# Patient Record
Sex: Male | Born: 1975 | Race: White | Hispanic: No | Marital: Married | Smoking: Current every day smoker
Health system: Southern US, Community
[De-identification: ages and names within clinical notes are randomized; demographics above are authoritative.]

## PROBLEM LIST (undated history)

## (undated) DIAGNOSIS — J45909 Unspecified asthma, uncomplicated: Secondary | ICD-10-CM

## (undated) DIAGNOSIS — G4730 Sleep apnea, unspecified: Secondary | ICD-10-CM

## (undated) DIAGNOSIS — J189 Pneumonia, unspecified organism: Secondary | ICD-10-CM

## (undated) HISTORY — DX: Unspecified asthma, uncomplicated: J45.909

---

## 2015-01-18 ENCOUNTER — Telehealth: Payer: Self-pay | Admitting: Family Medicine

## 2015-01-18 NOTE — Telephone Encounter (Signed)
Patient aware that our 1st new patient appointment will not be until the end of April and he is going to try and find somewhere else to be seen before then.

## 2016-02-19 ENCOUNTER — Emergency Department (HOSPITAL_COMMUNITY): Payer: Self-pay

## 2016-02-19 ENCOUNTER — Emergency Department (HOSPITAL_COMMUNITY)
Admission: EM | Admit: 2016-02-19 | Discharge: 2016-02-19 | Disposition: A | Discharge status: Home / Self Care | Payer: Self-pay | Attending: Emergency Medicine | Admitting: Emergency Medicine

## 2016-02-19 ENCOUNTER — Encounter (HOSPITAL_COMMUNITY): Payer: Self-pay | Admitting: *Deleted

## 2016-02-19 DIAGNOSIS — S43401A Unspecified sprain of right shoulder joint, initial encounter: Secondary | ICD-10-CM | POA: Insufficient documentation

## 2016-02-19 DIAGNOSIS — Y999 Unspecified external cause status: Secondary | ICD-10-CM | POA: Insufficient documentation

## 2016-02-19 DIAGNOSIS — Y9389 Activity, other specified: Secondary | ICD-10-CM | POA: Insufficient documentation

## 2016-02-19 DIAGNOSIS — F17210 Nicotine dependence, cigarettes, uncomplicated: Secondary | ICD-10-CM | POA: Insufficient documentation

## 2016-02-19 DIAGNOSIS — Y929 Unspecified place or not applicable: Secondary | ICD-10-CM | POA: Insufficient documentation

## 2016-02-19 DIAGNOSIS — X509XXA Other and unspecified overexertion or strenuous movements or postures, initial encounter: Secondary | ICD-10-CM | POA: Insufficient documentation

## 2016-02-19 MED ORDER — KETOROLAC TROMETHAMINE 60 MG/2ML IM SOLN
60.0000 mg | Freq: Once | INTRAMUSCULAR | Status: AC
  Administered 2016-02-19: 60 mg via INTRAMUSCULAR
  Filled 2016-02-19: qty 2

## 2016-02-19 MED ORDER — DIAZEPAM 5 MG PO TABS
5.0000 mg | ORAL_TABLET | Freq: Once | ORAL | Status: AC
  Administered 2016-02-19: 5 mg via ORAL
  Filled 2016-02-19: qty 1

## 2016-02-19 MED ORDER — CYCLOBENZAPRINE HCL 10 MG PO TABS: 10.0000 mg | ORAL_TABLET | Freq: Two times a day (BID) | ORAL | Status: DC | PRN

## 2016-02-19 MED ORDER — OXYCODONE-ACETAMINOPHEN 5-325 MG PO TABS
2.0000 | ORAL_TABLET | Freq: Once | ORAL | Status: AC
  Administered 2016-02-19: 2 via ORAL
  Filled 2016-02-19: qty 2

## 2016-02-19 MED ORDER — OXYCODONE-ACETAMINOPHEN 5-325 MG PO TABS: 2.0000 | ORAL_TABLET | ORAL | Status: DC | PRN

## 2016-02-19 MED ORDER — IBUPROFEN 800 MG PO TABS: 800.0000 mg | ORAL_TABLET | Freq: Three times a day (TID) | ORAL | Status: DC | PRN

## 2016-02-19 NOTE — ED Provider Notes (Signed)
CSN: [id number]     Arrival date & time 02/19/16  1957 History   First MD Initiated Contact with Patient 02/19/16 1959     Chief Complaint  Patient presents with  . Shoulder Pain     (Consider location/radiation/quality/duration/timing/severity/associated sxs/prior Treatment) Patient is a 40 y.o. male presenting with shoulder pain.  Shoulder Pain Location:  Shoulder Injury: no   Pain details:    Quality:  Aching and sharp   Radiates to:  Chest   Severity:  Mild   Onset quality:  Gradual   Duration:  3 days   Timing:  Constant   Progression:  Worsening Chronicity:  New Dislocation: no   Prior injury to area:  No Relieved by:  None tried Worsened by:  Nothing tried Ineffective treatments:  None tried Associated symptoms: no back pain, no fatigue and no fever     History reviewed. No pertinent past medical history. History reviewed. No pertinent past surgical history. No family history on file. Social History  Substance Use Topics  . Smoking status: Current Every Day Smoker    Types: Cigarettes  . Smokeless tobacco: None  . Alcohol Use: Yes     Comment: occ    Review of Systems  Constitutional: Negative for fever and fatigue.  Musculoskeletal: Negative for back pain.       Right shoulder pain  All other systems reviewed and are negative.     Allergies  Review of patient's allergies indicates no known allergies.  Home Medications   Prior to Admission medications   Medication Sig Start Date End Date Taking? Authorizing Provider  cyclobenzaprine (FLEXERIL) 10 MG tablet Take 1 tablet (10 mg total) by mouth 2 (two) times daily as needed for muscle spasms. 02/19/16   Marily MemosJason Rosevelt Luu, MD  ibuprofen (ADVIL,MOTRIN) 800 MG tablet Take 1 tablet (800 mg total) by mouth every 8 (eight) hours as needed for moderate pain. 02/19/16   Marily MemosJason Kyre Jeffries, MD  oxyCODONE-acetaminophen (PERCOCET/ROXICET) 5-325 MG tablet Take 2 tablets by mouth every 4 (four) hours as needed for severe pain.  02/19/16   Desi Rowe, MD   BP 130/81 mmHg  Pulse 105  Temp(Src) 98.7 F (37.1 C) (Oral)  Resp 22  Ht 6\' 1"  (1.854 m)  Wt 240 lb (108.863 kg)  BMI 31.67 kg/m2  SpO2 95% Physical Exam  Constitutional: He is oriented to person, place, and time. He appears well-developed and well-nourished.  HENT:  Head: Normocephalic and atraumatic.  Neck: Normal range of motion.  Cardiovascular: Normal rate.   Pulmonary/Chest: Effort normal. No respiratory distress.  Abdominal: Soft. He exhibits no distension. There is no tenderness.  Musculoskeletal: Normal range of motion. He exhibits tenderness (with ROM of right shoulder that radiates down through his latissimus on the same side with ttp in right sided latissimus dorsi).  Neurological: He is alert and oriented to person, place, and time. No cranial nerve deficit. Coordination normal.  Skin: Skin is warm and dry.  Nursing note and vitals reviewed.   ED Course  Procedures (including critical care time) Labs Review Labs Reviewed - No data to display  Imaging Review Dg Shoulder Right  02/19/2016  CLINICAL DATA:  Right shoulder pain since trying to pull a rope 3 weeks ago. Initial encounter. EXAM: RIGHT SHOULDER - 2+ VIEW COMPARISON:  None. FINDINGS: There is no evidence of fracture or dislocation. There is no evidence of arthropathy or other focal bone abnormality. Soft tissues are unremarkable. IMPRESSION: Negative. Electronically Signed   By: Marnee SpringJonathon  Watts  M.D.   On: 02/19/2016 20:39   I have personally reviewed and evaluated these images and lab results as part of my medical decision-making.   EKG Interpretation None      MDM   Final diagnoses:  Shoulder sprain, right, initial encounter   Suspect muscle spasm. Will tx w/ pain meds and muscle relaxer. Will xr to ensure no bone tumors or other abnormality.  XR negative. Symptoms improved significantly with medications. Still suspect likely muscular etiology. Will dc with short course  of pain meds, motrin and muscle relaxers.   New Prescriptions: Discharge Medication List as of 02/19/2016  8:43 PM    START taking these medications   Details  cyclobenzaprine (FLEXERIL) 10 MG tablet Take 1 tablet (10 mg total) by mouth 2 (two) times daily as needed for muscle spasms., Starting 02/19/2016, Until Discontinued, Print    ibuprofen (ADVIL,MOTRIN) 800 MG tablet Take 1 tablet (800 mg total) by mouth every 8 (eight) hours as needed for moderate pain., Starting 02/19/2016, Until Discontinued, Print    oxyCODONE-acetaminophen (PERCOCET/ROXICET) 5-325 MG tablet Take 2 tablets by mouth every 4 (four) hours as needed for severe pain., Starting 02/19/2016, Until Discontinued, Print         I have personally and contemperaneously reviewed labs and imaging and used in my decision making as above.   A medical screening exam was performed and I feel the patient has had an appropriate workup for their chief complaint at this time and likelihood of emergent condition existing is low. Their vital signs are stable. They have been counseled on decision, discharge, follow up and which symptoms necessitate immediate return to the emergency department.  They verbally stated understanding and agreement with plan and discharged in stable condition.      Marily Memos, MD 02/20/16 [phone]

## 2016-02-19 NOTE — ED Notes (Signed)
Pt reports pulling on a rope 3 days ago trying to start a piece of equipment. States increased pain to the right shoulder since. Denies any injury.

## 2016-02-19 NOTE — Discharge Instructions (Signed)
The sling is for comfort only. As much as you can, perform shoulder exercises as in the hand-out to avoid having a frozen shoulder. As soon as you can get back to moving it fully without pain, do not use the sling any longer.

## 2016-02-19 NOTE — ED Notes (Signed)
Pt alert & oriented x4, stable gait. Patient given discharge instructions, paperwork & prescription(s). Patient informed not to drive, operate any equipment & handel any important documents 4 hours after taking pain medication. Patient  instructed to stop at the registration desk to finish any additional paperwork. Patient  verbalized understanding. Pt left department w/ no further questions. 

## 2016-02-21 ENCOUNTER — Inpatient Hospital Stay (HOSPITAL_COMMUNITY)
Admission: EM | Admit: 2016-02-21 | Discharge: 2016-03-02 | DRG: 853 | Disposition: A | Discharge status: Home / Self Care | Payer: Self-pay | Attending: Internal Medicine | Admitting: Internal Medicine

## 2016-02-21 ENCOUNTER — Emergency Department (HOSPITAL_COMMUNITY): Payer: Self-pay

## 2016-02-21 ENCOUNTER — Encounter (HOSPITAL_COMMUNITY): Payer: Self-pay | Admitting: Emergency Medicine

## 2016-02-21 DIAGNOSIS — F17210 Nicotine dependence, cigarettes, uncomplicated: Secondary | ICD-10-CM | POA: Diagnosis present

## 2016-02-21 DIAGNOSIS — E876 Hypokalemia: Secondary | ICD-10-CM | POA: Diagnosis not present

## 2016-02-21 DIAGNOSIS — J80 Acute respiratory distress syndrome: Secondary | ICD-10-CM | POA: Diagnosis not present

## 2016-02-21 DIAGNOSIS — J9601 Acute respiratory failure with hypoxia: Secondary | ICD-10-CM | POA: Diagnosis present

## 2016-02-21 DIAGNOSIS — R079 Chest pain, unspecified: Secondary | ICD-10-CM | POA: Diagnosis present

## 2016-02-21 DIAGNOSIS — J189 Pneumonia, unspecified organism: Secondary | ICD-10-CM | POA: Diagnosis present

## 2016-02-21 DIAGNOSIS — Z978 Presence of other specified devices: Secondary | ICD-10-CM | POA: Diagnosis present

## 2016-02-21 DIAGNOSIS — R6520 Severe sepsis without septic shock: Secondary | ICD-10-CM | POA: Diagnosis present

## 2016-02-21 DIAGNOSIS — J869 Pyothorax without fistula: Secondary | ICD-10-CM | POA: Diagnosis present

## 2016-02-21 DIAGNOSIS — J9602 Acute respiratory failure with hypercapnia: Secondary | ICD-10-CM

## 2016-02-21 DIAGNOSIS — R0902 Hypoxemia: Secondary | ICD-10-CM

## 2016-02-21 DIAGNOSIS — Z4682 Encounter for fitting and adjustment of non-vascular catheter: Secondary | ICD-10-CM

## 2016-02-21 DIAGNOSIS — J9690 Respiratory failure, unspecified, unspecified whether with hypoxia or hypercapnia: Secondary | ICD-10-CM

## 2016-02-21 DIAGNOSIS — Z9889 Other specified postprocedural states: Secondary | ICD-10-CM

## 2016-02-21 DIAGNOSIS — A419 Sepsis, unspecified organism: Principal | ICD-10-CM | POA: Diagnosis present

## 2016-02-21 DIAGNOSIS — K9189 Other postprocedural complications and disorders of digestive system: Secondary | ICD-10-CM

## 2016-02-21 DIAGNOSIS — I10 Essential (primary) hypertension: Secondary | ICD-10-CM | POA: Diagnosis present

## 2016-02-21 DIAGNOSIS — R109 Unspecified abdominal pain: Secondary | ICD-10-CM

## 2016-02-21 DIAGNOSIS — J989 Respiratory disorder, unspecified: Secondary | ICD-10-CM

## 2016-02-21 DIAGNOSIS — M25511 Pain in right shoulder: Secondary | ICD-10-CM | POA: Diagnosis present

## 2016-02-21 DIAGNOSIS — K567 Ileus, unspecified: Secondary | ICD-10-CM | POA: Diagnosis not present

## 2016-02-21 DIAGNOSIS — J90 Pleural effusion, not elsewhere classified: Secondary | ICD-10-CM | POA: Diagnosis present

## 2016-02-21 DIAGNOSIS — J9600 Acute respiratory failure, unspecified whether with hypoxia or hypercapnia: Secondary | ICD-10-CM | POA: Diagnosis present

## 2016-02-21 DIAGNOSIS — G4733 Obstructive sleep apnea (adult) (pediatric): Secondary | ICD-10-CM | POA: Diagnosis present

## 2016-02-21 DIAGNOSIS — R0602 Shortness of breath: Secondary | ICD-10-CM | POA: Diagnosis present

## 2016-02-21 MED ORDER — SODIUM CHLORIDE 0.9 % IV BOLUS (SEPSIS)
1000.0000 mL | Freq: Once | INTRAVENOUS | Status: AC
  Administered 2016-02-22: 1000 mL via INTRAVENOUS

## 2016-02-21 MED ORDER — SODIUM CHLORIDE 0.9 % IV SOLN
INTRAVENOUS | Status: DC
  Administered 2016-02-22: 02:00:00 via INTRAVENOUS

## 2016-02-21 MED ORDER — FENTANYL CITRATE (PF) 100 MCG/2ML IJ SOLN
50.0000 ug | Freq: Once | INTRAMUSCULAR | Status: AC
  Administered 2016-02-22: 50 ug via INTRAVENOUS
  Filled 2016-02-21: qty 2

## 2016-02-21 NOTE — ED Notes (Signed)
Patient states he was treated here on April 15 for "a pulled muscle in my rotator cuff and it goes down into my right side." States he was given flexeril and ibuprofen and the medication is not working. States he cannot get a deep breath due to the pain.

## 2016-02-21 NOTE — ED Notes (Signed)
Pt states he took his flexeril and ibuprofen while in the waiting room to see if it would relieve his pain some

## 2016-02-21 NOTE — ED Provider Notes (Signed)
CSN: [id number]     Arrival date & time 02/21/16  2040 History  By signing my name below, I, [name] Snow, attest that this documentation has been prepared under the direction and in the presence of Devoria Albe, MD at 2331 PM. Electronically Signed: Phillis Snow, ED Scribe. 02/21/2016. 11:45 PM.  Chief Complaint  Patient presents with  . Muscle Pain   The history is provided by the patient. No language interpreter was used.  HPI Comments: [name] Snow is a 40 y.o. male who presents to the Emergency Department complaining of constant, gradually worsening right shoulder pain that radiates to the right upper back onset one week ago. Pt was pulling the starter rope on a tiller and thinks he injured his right shoulder. He states that the pain was not there initially, but started a day later and gradually worsened. He states that coughing has worsened the pain and that he has been coughing more today. He also reports that any sort of movement worsens the pain. He reports associated SOB and spasm-like pain in his right axilla that began when he hurt his shoulder. Pt was seen and evaluated in the ED on 02/19/16. Pt was discharged with a negative x-ray, diagnosis of muscle spasm, and prescribed Flexeril, Motrin and percocet. He denies alleviating factors. He denies fever, chills, or hemoptysis. Pt is a smoker, 1 ppd.   PCP none  History reviewed. No pertinent past medical history. History reviewed. No pertinent past surgical history. History reviewed. No pertinent family history. Social History  Substance Use Topics  . Smoking status: Current Every Day Smoker    Types: Cigarettes  . Smokeless tobacco: None  . Alcohol Use: Yes     Comment: occ  smokes 1 ppd unemployed  Review of Systems  Constitutional: Negative for fever and chills.  Respiratory: Positive for cough and shortness of breath.   Gastrointestinal: Positive for abdominal pain.  Musculoskeletal: Positive for arthralgias.  All  other systems reviewed and are negative.  Allergies  Review of patient's allergies indicates no known allergies.  Home Medications   Prior to Admission medications   Medication Sig Start Date End Date Taking? Authorizing Provider  cyclobenzaprine (FLEXERIL) 10 MG tablet Take 1 tablet (10 mg total) by mouth 2 (two) times daily as needed for muscle spasms. 02/19/16  Yes Marily Memos, MD  ibuprofen (ADVIL,MOTRIN) 800 MG tablet Take 1 tablet (800 mg total) by mouth every 8 (eight) hours as needed for moderate pain. 02/19/16  Yes Marily Memos, MD  oxyCODONE-acetaminophen (PERCOCET/ROXICET) 5-325 MG tablet Take 2 tablets by mouth every 4 (four) hours as needed for severe pain. Patient not taking: Reported on 02/21/2016 02/19/16   Marily Memos, MD   BP 122/84 mmHg  Pulse 116  Temp(Src) 97.4 F (36.3 C) (Oral)  Resp 22  Ht  (1.854 m)  Wt 237 lb 3.4 oz (107.6 kg)  BMI 31.30 kg/m2  SpO2 93% Physical Exam  Constitutional: He is oriented to person, place, and time. He appears well-developed and well-nourished.  Non-toxic appearance. He does not appear ill. No distress.  HENT:  Head: Normocephalic and atraumatic.  Right Ear: External ear normal.  Left Ear: External ear normal.  Nose: Nose normal. No mucosal edema or rhinorrhea.  Mouth/Throat: Oropharynx is clear and moist and mucous membranes are normal. No dental abscesses or uvula swelling.  Eyes: Conjunctivae and EOM are normal. Pupils are equal, round, and reactive to light.  Neck: Normal range of motion and full passive range of  motion without pain. Neck supple.  Cardiovascular: Normal rate, regular rhythm and normal heart sounds.  Exam reveals no gallop and no friction rub.   No murmur heard. Pulmonary/Chest: Accessory muscle usage present. Tachypnea noted. He is in respiratory distress. He has decreased breath sounds in the right upper field, the right middle field and the right lower field. He has no wheezes. He has no rhonchi. He has  no rales. He exhibits no tenderness and no crepitus.  Patient has decreased tactile fremitus on the right. He has egophony on the right. He has very diminished breath sounds especially at the right base. There are still some breath sounds in the apices  Abdominal: Soft. Normal appearance and bowel sounds are normal. He exhibits no distension. There is no tenderness. There is no rebound and no guarding.  Musculoskeletal: Normal range of motion. He exhibits no edema or tenderness.  Moves all extremities well.   Neurological: He is alert and oriented to person, place, and time. He has normal strength. No cranial nerve deficit.  Skin: Skin is warm, dry and intact. No rash noted. No erythema. No pallor.  Psychiatric: He has a normal mood and affect. His speech is normal and behavior is normal. His mood appears not anxious.  Nursing note and vitals reviewed.   ED Course  Procedures (including critical care time)  Medications  0.9 %  sodium chloride infusion ( Intravenous New Bag/Given 02/22/16 0147)  azithromycin (ZITHROMAX) 500 mg in dextrose 5 % 250 mL IVPB (500 mg Intravenous New Bag/Given 02/22/16 0144)  fentaNYL (SUBLIMAZE) injection 50 mcg (50 mcg Intravenous Given 02/22/16 0027)  sodium chloride 0.9 % bolus 1,000 mL (0 mLs Intravenous Stopped 02/22/16 0127)  cefTRIAXone (ROCEPHIN) 1 g in dextrose 5 % 50 mL IVPB (0 g Intravenous Stopped 02/22/16 0143)  fentaNYL (SUBLIMAZE) injection 100 mcg (100 mcg Intravenous Given 02/22/16 0142)  ketorolac (TORADOL) 30 MG/ML injection 30 mg (30 mg Intravenous Given 02/22/16 0141)   DIAGNOSTIC STUDIES: Oxygen Saturation is 96% on RA, normal by my interpretation.    COORDINATION OF CARE: 11:42 PM-Discussed treatment plan which includes chest x-ray to include right ribs, IV and pain medication with pt at bedside and pt agreed to plan.   12:30 AM After reviewing patient's chest x-ray he was started on community acquired pneumonia antibiotics, Rocephin and  Zithromax. I talked to the patient about his test results. We discussed getting a CT of his chest to get a clearer idea of what was going on.   Patient had difficulty and was unable to do the CT scan until he received more pain medication. He states it was too painful to lay down.  Patient CT scan shows probable pneumonia and a large pleural effusion. I have talked to patient that he will need admission.  Patient's pulse ox remained in the mid 90s without oxygen. After his blood gas was obtained he was placed on nasal cannula 2 L/m nasal cannula.  02:25 Dr Onalee Hua, admit to ICU, requests ABG, lactic acid, HIV    Labs Review Results for orders placed or performed during the hospital encounter of 02/21/16  Comprehensive metabolic panel  Result Value Ref Range   Sodium 135 135 - 145 mmol/L   Potassium 3.9 3.5 - 5.1 mmol/L   Chloride 104 101 - 111 mmol/L   CO2 22 22 - 32 mmol/L   Glucose, Bld 113 (H) 65 - 99 mg/dL   BUN 17 6 - 20 mg/dL   Creatinine, Ser 1.61 0.61 -  1.24 mg/dL   Calcium 8.0 (L) 8.9 - 10.3 mg/dL   Total Protein 6.8 6.5 - 8.1 g/dL   Albumin 3.5 3.5 - 5.0 g/dL   AST 19 15 - 41 U/L   ALT 41 17 - 63 U/L   Alkaline Phosphatase 57 38 - 126 U/L   Total Bilirubin 2.2 (H) 0.3 - 1.2 mg/dL   GFR calc non Af Amer >60 >60 mL/min   GFR calc Af Amer >60 >60 mL/min   Anion gap 9 5 - 15  CBC with Differential  Result Value Ref Range   WBC 11.2 (H) 4.0 - 10.5 K/uL   RBC 4.29 4.22 - 5.81 MIL/uL   Hemoglobin 13.2 13.0 - 17.0 g/dL   HCT 16.1 (L) 09.6 - 04.5 %   MCV 87.9 78.0 - 100.0 fL   MCH 30.8 26.0 - 34.0 pg   MCHC 35.0 30.0 - 36.0 g/dL   RDW 40.9 81.1 - 91.4 %   Platelets 140 (L) 150 - 400 K/uL   Neutrophils Relative % 83 %   Neutro Abs 9.2 (H) 1.7 - 7.7 K/uL   Lymphocytes Relative 9 %   Lymphs Abs 1.1 0.7 - 4.0 K/uL   Monocytes Relative 8 %   Monocytes Absolute 0.9 0.1 - 1.0 K/uL   Eosinophils Relative 0 %   Eosinophils Absolute 0.1 0.0 - 0.7 K/uL   Basophils Relative 0 %    Basophils Absolute 0.0 0.0 - 0.1 K/uL  Lactic acid, plasma  Result Value Ref Range   Lactic Acid, Venous 0.8 0.5 - 2.0 mmol/L  Blood gas, arterial  Result Value Ref Range   FIO2 0.21    pH, Arterial 7.419 7.350 - 7.450   pCO2 arterial 34.3 (L) 35.0 - 45.0 mmHg   pO2, Arterial 59.8 (L) 80.0 - 100.0 mmHg   Bicarbonate 22.9 20.0 - 24.0 mEq/L   Acid-base deficit 2.0 0.0 - 2.0 mmol/L   O2 Saturation 90.3 %   Patient temperature 37.0    Allens test (pass/fail) POSITIVE (A) PASS  Procalcitonin - Baseline  Result Value Ref Range   Procalcitonin 0.53 ng/mL  Troponin I  Result Value Ref Range   Troponin I <0.03 <0.031 ng/mL   Laboratory interpretation all normal except leukocytosis, ABG shows hypoxia on room air     Imaging Review Dg Ribs Unilateral W/chest Right  02/22/2016  CLINICAL DATA:  Shortness of breath. Increasing right chest and rib pain. Diminished breath sounds on the right. EXAM: RIGHT RIBS AND CHEST - 3+ VIEW COMPARISON:  None. FINDINGS: Complete opacification of the lower 1/2-2/3 of right hemithorax. This likely represents a combination of pleural fluid atelectasis or consolidation. Questionable trace left pleural effusion. Heart is normal in size, partially obscured by right hemithorax opacity. No pulmonary edema. No pneumothorax. No fracture or acute bony abnormality of the right ribs. IMPRESSION: 1. Opacification of the lower 1/2 of right hemithorax, likely combination of pleural fluid and atelectasis or consolidation. No pneumothorax or acute rib fracture is seen. 2. Question small left pleural effusion. Electronically Signed   By: Rubye Oaks M.D.   On: 02/22/2016 00:33   Ct Chest Wo Contrast  02/22/2016  CLINICAL DATA:  Right shoulder pain radiating to the right upper back for 1 week. Patient injured the right shoulder while pulling on patellar. Cough today. Shortness of breath. Follow-up of abnormal chest x-ray. EXAM: CT CHEST WITHOUT CONTRAST TECHNIQUE:  Multidetector CT imaging of the chest was performed following the standard protocol without IV contrast.  COMPARISON:  Chest radiograph 02/22/2016 FINDINGS: Normal heart size. Mild pericardial thickening. Normal caliber thoracic aorta. No significant lymphadenopathy in the chest. Esophagus is mostly decompressed. Evaluation of lungs is limited due to respiratory motion artifact. There is a moderate-sized right pleural effusion with consolidation in the right lung base. Fluid in the fissures. Changes are nonspecific but could indicate pneumonia. Emphysematous changes in the lungs. Airways appear patent. Included portions of the upper abdominal organs demonstrate no gross abnormality in the unenhanced imaging. Degenerative changes in the spine. No destructive bone lesions. Ribs appear intact. IMPRESSION: Moderate size right pleural effusion with basilar consolidation. Appearance is nonspecific but could indicate pneumonia. Followup PA and lateral chest X-ray is recommended in 3-4 weeks following trial of antibiotic therapy to ensure resolution and exclude underlying malignancy. Electronically Signed   By: Burman NievesWilliam  Stevens M.D.   On: 02/22/2016 02:03   I have personally reviewed and evaluated these images and lab results as part of my medical decision-making.   EKG Interpretation   Date/Time:  Tuesday February 22 2016 02:48:43 EDT Ventricular Rate:  113 PR Interval:  151 QRS Duration: 97 QT Interval:  323 QTC Calculation: 443 R Axis:   115 Text Interpretation:  Sinus tachycardia Right axis deviation No old  tracing to compare Confirmed by Javani Spratt  MD-I, Aneya Daddona ([id number]) on 02/22/2016  3:03:45 AM      MDM   Final diagnoses:  Pleural effusion, right  CAP (community acquired pneumonia)  Respiratory disease   Plan admission   Devoria AlbeIva Zanobia Griebel, MD, FACEP    I personally performed the services described in this documentation, which was scribed in my presence. The recorded information has been reviewed and  considered.  Devoria AlbeIva Chaunice Obie, MD, Concha PyoFACEP   Carzell Saldivar, MD 02/22/16 [phone]

## 2016-02-21 NOTE — ED Notes (Signed)
Called from registration with patient complaining of feeling like he was going to pass out. Brought patient to triage and obtained new set of vital signs. Patient's respirations 30 bpm. Patient with shallow respirations, states "I can't slow down my breathing or get a deep breath because it hurts."

## 2016-02-22 ENCOUNTER — Inpatient Hospital Stay (HOSPITAL_COMMUNITY): Payer: Self-pay

## 2016-02-22 ENCOUNTER — Emergency Department (HOSPITAL_COMMUNITY): Payer: Self-pay

## 2016-02-22 DIAGNOSIS — R0600 Dyspnea, unspecified: Secondary | ICD-10-CM

## 2016-02-22 DIAGNOSIS — J90 Pleural effusion, not elsewhere classified: Secondary | ICD-10-CM | POA: Diagnosis present

## 2016-02-22 DIAGNOSIS — J9600 Acute respiratory failure, unspecified whether with hypoxia or hypercapnia: Secondary | ICD-10-CM | POA: Diagnosis present

## 2016-02-22 DIAGNOSIS — R079 Chest pain, unspecified: Secondary | ICD-10-CM | POA: Diagnosis present

## 2016-02-22 DIAGNOSIS — J189 Pneumonia, unspecified organism: Secondary | ICD-10-CM | POA: Diagnosis present

## 2016-02-22 DIAGNOSIS — R0602 Shortness of breath: Secondary | ICD-10-CM | POA: Diagnosis present

## 2016-02-22 DIAGNOSIS — J989 Respiratory disorder, unspecified: Secondary | ICD-10-CM

## 2016-02-22 LAB — COMPREHENSIVE METABOLIC PANEL
ALBUMIN: 3.4 g/dL — AB (ref 3.5–5.0)
ALK PHOS: 56 U/L (ref 38–126)
ALT: 41 U/L (ref 17–63)
ALT: 38 U/L (ref 17–63)
ANION GAP: 9 (ref 5–15)
AST: 19 U/L (ref 15–41)
AST: 17 U/L (ref 15–41)
Albumin: 3.5 g/dL (ref 3.5–5.0)
Alkaline Phosphatase: 57 U/L (ref 38–126)
Anion gap: 9 (ref 5–15)
BILIRUBIN TOTAL: 1.6 mg/dL — AB (ref 0.3–1.2)
BUN: 17 mg/dL (ref 6–20)
BUN: 17 mg/dL (ref 6–20)
CALCIUM: 8.2 mg/dL — AB (ref 8.9–10.3)
CHLORIDE: 104 mmol/L (ref 101–111)
CO2: 22 mmol/L (ref 22–32)
CO2: 21 mmol/L — AB (ref 22–32)
CREATININE: 1.05 mg/dL (ref 0.61–1.24)
Calcium: 8 mg/dL — ABNORMAL LOW (ref 8.9–10.3)
Chloride: 106 mmol/L (ref 101–111)
Creatinine, Ser: 1.01 mg/dL (ref 0.61–1.24)
GFR calc Af Amer: >60 mL/min (ref >60)
GFR calc non Af Amer: >60 mL/min (ref >60)
GLUCOSE: 123 mg/dL — AB (ref 65–99)
Glucose, Bld: 113 mg/dL — ABNORMAL HIGH (ref 65–99)
POTASSIUM: 3.7 mmol/L (ref 3.5–5.1)
POTASSIUM: 3.9 mmol/L (ref 3.5–5.1)
SODIUM: 136 mmol/L (ref 135–145)
Sodium: 135 mmol/L (ref 135–145)
TOTAL PROTEIN: 6.4 g/dL — AB (ref 6.5–8.1)
Total Bilirubin: 2.2 mg/dL — ABNORMAL HIGH (ref 0.3–1.2)
Total Protein: 6.8 g/dL (ref 6.5–8.1)

## 2016-02-22 LAB — BODY FLUID CELL COUNT WITH DIFFERENTIAL
Eos, Fluid: 0 %
LYMPHS FL: 3 %
Monocyte-Macrophage-Serous Fluid: 3 % — ABNORMAL LOW (ref 50–90)
NEUTROPHIL FLUID: 94 % — AB (ref 0–25)
Other Cells, Fluid: 0 %
Total Nucleated Cell Count, Fluid: 3867 cu mm — ABNORMAL HIGH (ref 0–1000)

## 2016-02-22 LAB — INFLUENZA PANEL BY PCR (TYPE A & B)
H1N1FLUPCR: NOT DETECTED
INFLAPCR: NEGATIVE
INFLBPCR: NEGATIVE

## 2016-02-22 LAB — LACTATE DEHYDROGENASE, PLEURAL OR PERITONEAL FLUID: LD, Fluid: 686 U/L — ABNORMAL HIGH (ref 3–23)

## 2016-02-22 LAB — CBC WITH DIFFERENTIAL/PLATELET
BASOS ABS: 0 10*3/uL (ref 0.0–0.1)
BASOS PCT: 0 %
Basophils Absolute: 0 10*3/uL (ref 0.0–0.1)
Basophils Relative: 0 %
EOS ABS: 0.1 10*3/uL (ref 0.0–0.7)
Eosinophils Absolute: 0.1 10*3/uL (ref 0.0–0.7)
Eosinophils Relative: 0 %
Eosinophils Relative: 1 %
HEMATOCRIT: 36.3 % — AB (ref 39.0–52.0)
HEMATOCRIT: 37.7 % — AB (ref 39.0–52.0)
HEMOGLOBIN: 12.6 g/dL — AB (ref 13.0–17.0)
HEMOGLOBIN: 13.2 g/dL (ref 13.0–17.0)
LYMPHS ABS: 1.1 10*3/uL (ref 0.7–4.0)
LYMPHS PCT: 9 %
Lymphocytes Relative: 9 %
Lymphs Abs: 1 10*3/uL (ref 0.7–4.0)
MCH: 30.8 pg (ref 26.0–34.0)
MCH: 30.4 pg (ref 26.0–34.0)
MCHC: 34.7 g/dL (ref 30.0–36.0)
MCHC: 35 g/dL (ref 30.0–36.0)
MCV: 87.7 fL (ref 78.0–100.0)
MCV: 87.9 fL (ref 78.0–100.0)
Monocytes Absolute: 0.9 10*3/uL (ref 0.1–1.0)
Monocytes Absolute: 0.8 10*3/uL (ref 0.1–1.0)
Monocytes Relative: 8 %
Monocytes Relative: 7 %
NEUTROS ABS: 9.3 10*3/uL — AB (ref 1.7–7.7)
NEUTROS PCT: 83 %
NEUTROS PCT: 83 %
Neutro Abs: 9.2 10*3/uL — ABNORMAL HIGH (ref 1.7–7.7)
Platelets: 140 10*3/uL — ABNORMAL LOW (ref 150–400)
Platelets: 143 10*3/uL — ABNORMAL LOW (ref 150–400)
RBC: 4.14 MIL/uL — ABNORMAL LOW (ref 4.22–5.81)
RBC: 4.29 MIL/uL (ref 4.22–5.81)
RDW: 14.4 % (ref 11.5–15.5)
RDW: 14.4 % (ref 11.5–15.5)
WBC: 11.1 10*3/uL — AB (ref 4.0–10.5)
WBC: 11.2 10*3/uL — AB (ref 4.0–10.5)

## 2016-02-22 LAB — LACTIC ACID, PLASMA
LACTIC ACID, VENOUS: 0.8 mmol/L (ref 0.5–2.0)
Lactic Acid, Venous: 0.8 mmol/L (ref 0.5–2.0)

## 2016-02-22 LAB — ECHOCARDIOGRAM COMPLETE
HEIGHTINCHES: 73 in
Weight: 3795.44 oz

## 2016-02-22 LAB — BLOOD GAS, ARTERIAL
ACID-BASE DEFICIT: 2 mmol/L (ref 0.0–2.0)
Allens test (pass/fail): POSITIVE — AB
BICARBONATE: 22.9 meq/L (ref 20.0–24.0)
FIO2: 0.21
O2 Saturation: 90.3 %
PCO2 ART: 34.3 mmHg — AB (ref 35.0–45.0)
PH ART: 7.419 (ref 7.350–7.450)
PO2 ART: 59.8 mmHg — AB (ref 80.0–100.0)
Patient temperature: 37

## 2016-02-22 LAB — BRAIN NATRIURETIC PEPTIDE: B Natriuretic Peptide: 30 pg/mL (ref 0.0–100.0)

## 2016-02-22 LAB — GLUCOSE, SEROUS FLUID: Glucose, Fluid: 67 mg/dL

## 2016-02-22 LAB — URINALYSIS, ROUTINE W REFLEX MICROSCOPIC
BILIRUBIN URINE: NEGATIVE
Glucose, UA: NEGATIVE mg/dL
Hgb urine dipstick: NEGATIVE
Ketones, ur: NEGATIVE mg/dL
Leukocytes, UA: NEGATIVE
NITRITE: NEGATIVE
PH: 5.5 (ref 5.0–8.0)
Protein, ur: NEGATIVE mg/dL
SPECIFIC GRAVITY, URINE: 1.01 (ref 1.005–1.030)

## 2016-02-22 LAB — MRSA PCR SCREENING: MRSA BY PCR: NEGATIVE

## 2016-02-22 LAB — TSH: TSH: 1.875 u[IU]/mL (ref 0.350–4.500)

## 2016-02-22 LAB — TROPONIN I: Troponin I: <0.03 ng/mL (ref <0.031)

## 2016-02-22 LAB — PROTEIN, BODY FLUID: Total protein, fluid: 5 g/dL

## 2016-02-22 LAB — PROCALCITONIN: PROCALCITONIN: 0.53 ng/mL

## 2016-02-22 LAB — STREP PNEUMONIAE URINARY ANTIGEN: Strep Pneumo Urinary Antigen: NEGATIVE

## 2016-02-22 MED ORDER — DEXTROSE 5 % IV SOLN
1.0000 g | INTRAVENOUS | Status: DC
  Administered 2016-02-22: 1 g via INTRAVENOUS
  Filled 2016-02-22 (×2): qty 10

## 2016-02-22 MED ORDER — SODIUM CHLORIDE 0.9 % IV SOLN
INTRAVENOUS | Status: AC
  Administered 2016-02-22: 04:00:00 via INTRAVENOUS

## 2016-02-22 MED ORDER — DEXTROSE 5 % IV SOLN
1.0000 g | Freq: Once | INTRAVENOUS | Status: AC
  Administered 2016-02-22: 1 g via INTRAVENOUS
  Filled 2016-02-22: qty 10

## 2016-02-22 MED ORDER — HYDROMORPHONE HCL 1 MG/ML IJ SOLN
1.0000 mg | INTRAMUSCULAR | Status: DC | PRN
  Administered 2016-02-22 – 2016-02-26 (×9): 1 mg via INTRAVENOUS
  Filled 2016-02-22 (×9): qty 1

## 2016-02-22 MED ORDER — VANCOMYCIN HCL 10 G IV SOLR
2000.0000 mg | Freq: Once | INTRAVENOUS | Status: AC
  Administered 2016-02-22: 2000 mg via INTRAVENOUS
  Filled 2016-02-22: qty 2000

## 2016-02-22 MED ORDER — KETOROLAC TROMETHAMINE 30 MG/ML IJ SOLN
30.0000 mg | Freq: Once | INTRAMUSCULAR | Status: AC
  Administered 2016-02-22: 30 mg via INTRAVENOUS
  Filled 2016-02-22: qty 1

## 2016-02-22 MED ORDER — IPRATROPIUM-ALBUTEROL 0.5-2.5 (3) MG/3ML IN SOLN
3.0000 mL | RESPIRATORY_TRACT | Status: DC | PRN
  Administered 2016-02-22: 3 mL via RESPIRATORY_TRACT
  Filled 2016-02-22: qty 3

## 2016-02-22 MED ORDER — DEXTROSE 5 % IV SOLN
500.0000 mg | INTRAVENOUS | Status: DC
  Administered 2016-02-22 – 2016-02-24 (×2): 500 mg via INTRAVENOUS
  Filled 2016-02-22 (×3): qty 500

## 2016-02-22 MED ORDER — DEXTROSE 5 % IV SOLN
500.0000 mg | INTRAVENOUS | Status: DC
  Administered 2016-02-22: 500 mg via INTRAVENOUS
  Filled 2016-02-22: qty 500

## 2016-02-22 MED ORDER — IPRATROPIUM-ALBUTEROL 0.5-2.5 (3) MG/3ML IN SOLN
3.0000 mL | Freq: Four times a day (QID) | RESPIRATORY_TRACT | Status: DC
  Administered 2016-02-23 – 2016-02-27 (×17): 3 mL via RESPIRATORY_TRACT
  Filled 2016-02-22 (×15): qty 3

## 2016-02-22 MED ORDER — FENTANYL CITRATE (PF) 100 MCG/2ML IJ SOLN
100.0000 ug | Freq: Once | INTRAMUSCULAR | Status: AC
  Administered 2016-02-22: 100 ug via INTRAVENOUS
  Filled 2016-02-22: qty 2

## 2016-02-22 MED ORDER — ACETAMINOPHEN 325 MG PO TABS
650.0000 mg | ORAL_TABLET | Freq: Four times a day (QID) | ORAL | Status: DC | PRN
  Administered 2016-02-22: 650 mg via ORAL
  Filled 2016-02-22: qty 2

## 2016-02-22 MED FILL — Oxycodone w/ Acetaminophen Tab 5-325 MG: ORAL | Qty: 6 | Status: AC

## 2016-02-22 NOTE — H&P (Signed)
PCP:   No primary care provider on file.   Chief Complaint:  Right shoulder pain  HPI: 40 yo male healthy comes in to ED actually for right shoulder pain that goes down into his right chest.  Pt reports he was doing some yard work and was pulling on something and injured his right shoulder.  He did not come in for sob.  Although he was very tachypneic on arrival to the ED.  Pt says he is always sob due to his sleep apnea that he has not been formally diagnosed with.  Pt denies any fevers.  Has been coughing and his breathing is worse when lying flat.  Pt also has been having some swelling in his legs for over 2 days.  Has pain to right chest worse with  Inspiration.  Pt did not think he was sick at old just injured his right shoulder.  Pt found to have right sided effusion/consolidation half of the lung with no PNX.  Pt referred for admission for pna.  Review of Systems:  Positive and negative as per HPI otherwise all other systems are negative  Past Medical History: History reviewed. No pertinent past medical history. History reviewed. No pertinent past surgical history. None  Medications: Prior to Admission medications   Medication Sig Start Date End Date Taking? Authorizing Provider  cyclobenzaprine (FLEXERIL) 10 MG tablet Take 1 tablet (10 mg total) by mouth 2 (two) times daily as needed for muscle spasms. 02/19/16  Yes Marily Memos, MD  ibuprofen (ADVIL,MOTRIN) 800 MG tablet Take 1 tablet (800 mg total) by mouth every 8 (eight) hours as needed for moderate pain. 02/19/16  Yes Marily Memos, MD  oxyCODONE-acetaminophen (PERCOCET/ROXICET) 5-325 MG tablet Take 2 tablets by mouth every 4 (four) hours as needed for severe pain. Patient not taking: Reported on 02/21/2016 02/19/16   Marily Memos, MD    Allergies:  No Known Allergies  Social History:  reports that he has been smoking Cigarettes.  He does not have any smokeless tobacco history on file. He reports that he drinks alcohol. He  reports that he does not use illicit drugs.  Family History: No premature CAD  Physical Exam: Filed Vitals:   02/21/16 2103 02/21/16 2149 02/21/16 2151 02/22/16 0234  BP: 122/92  143/86 122/84  Pulse: 125  119 116  Temp: 97.5 F (36.4 C)     TempSrc: Oral     Resp: Height:  (1.854 m)     Weight: 109.77 kg (242 lb)     SpO2: 95%  96% 93%   General appearance: alert, cooperative and mild distress tachynpeic able to speak in full sentences.  mentating normally, color good. Head: Normocephalic, without obvious abnormality, atraumatic Eyes: negative Nose: Nares normal. Septum midline. Mucosa normal. No drainage or sinus tenderness. Neck: no JVD and supple, symmetrical, trachea midline Lungs: diminished breath sounds RLL and RML Heart: regular rate and rhythm, S1, S2 normal, no murmur, click, rub or gallop Abdomen: soft, non-tender; bowel sounds normal; no masses,  no organomegaly Extremities: edema 1+ ble pitting edema Pulses: 2+ and symmetric Skin: Skin color, texture, turgor normal. No rashes or lesions Neurologic: Grossly normal   Labs on Admission:   Recent Labs  02/22/16 0046  NA 135  K 3.9  CL 104  CO2 22  GLUCOSE 113*  BUN 17  CREATININE 1.05  CALCIUM 8.0*    Recent Labs  02/22/16 0046  AST 19  ALT 41  ALKPHOS 57  BILITOT 2.2*  PROT 6.8  ALBUMIN 3.5    Recent Labs  02/22/16 0046  WBC 11.2*  NEUTROABS 9.2*  HGB 13.2  HCT 37.7*  MCV 87.9  PLT 140*   Radiological Exams on Admission: Dg Ribs Unilateral W/chest Right  02/22/2016  CLINICAL DATA:  Shortness of breath. Increasing right chest and rib pain. Diminished breath sounds on the right. EXAM: RIGHT RIBS AND CHEST - 3+ VIEW COMPARISON:  None. FINDINGS: Complete opacification of the lower 1/2-2/3 of right hemithorax. This likely represents a combination of pleural fluid atelectasis or consolidation. Questionable trace left pleural effusion. Heart is normal in size, partially  obscured by right hemithorax opacity. No pulmonary edema. No pneumothorax. No fracture or acute bony abnormality of the right ribs. IMPRESSION: 1. Opacification of the lower 1/2 of right hemithorax, likely combination of pleural fluid and atelectasis or consolidation. No pneumothorax or acute rib fracture is seen. 2. Question small left pleural effusion. Electronically Signed   By: Rubye OaksMelanie  Ehinger M.D.   On: 02/22/2016 00:33   Dg Shoulder Right  02/19/2016  CLINICAL DATA:  Right shoulder pain since trying to pull a rope 3 weeks ago. Initial encounter. EXAM: RIGHT SHOULDER - 2+ VIEW COMPARISON:  None. FINDINGS: There is no evidence of fracture or dislocation. There is no evidence of arthropathy or other focal bone abnormality. Soft tissues are unremarkable. IMPRESSION: Negative. Electronically Signed   By: Marnee SpringJonathon  Watts M.D.   On: 02/19/2016 20:39   Ct Chest Wo Contrast  02/22/2016  CLINICAL DATA:  Right shoulder pain radiating to the right upper back for 1 week. Patient injured the right shoulder while pulling on patellar. Cough today. Shortness of breath. Follow-up of abnormal chest x-ray. EXAM: CT CHEST WITHOUT CONTRAST TECHNIQUE: Multidetector CT imaging of the chest was performed following the standard protocol without IV contrast. COMPARISON:  Chest radiograph 02/22/2016 FINDINGS: Normal heart size. Mild pericardial thickening. Normal caliber thoracic aorta. No significant lymphadenopathy in the chest. Esophagus is mostly decompressed. Evaluation of lungs is limited due to respiratory motion artifact. There is a moderate-sized right pleural effusion with consolidation in the right lung base. Fluid in the fissures. Changes are nonspecific but could indicate pneumonia. Emphysematous changes in the lungs. Airways appear patent. Included portions of the upper abdominal organs demonstrate no gross abnormality in the unenhanced imaging. Degenerative changes in the spine. No destructive bone lesions. Ribs  appear intact. IMPRESSION: Moderate size right pleural effusion with basilar consolidation. Appearance is nonspecific but could indicate pneumonia. Followup PA and lateral chest X-ray is recommended in 3-4 weeks following trial of antibiotic therapy to ensure resolution and exclude underlying malignancy. Electronically Signed   By: Burman NievesWilliam  Stevens M.D.   On: 02/22/2016 02:03    cxr xray reviewed with right consolidation half way with effusion Case discussed with dr i knapp in ed Records reviewed ekg reviewed sinus tachycardia rate 113 no acute issues  Assessment/Plan  [age] yo male with acute respiratory distress from right pleural effusion with consolidation likely from CAP  Principal Problem:   CAP (community acquired pneumonia)- presumptive.  Place on pna pathway, add iv vancomycin to rocephin/azithro given in ED due to severity of illness.  im concerned this may be cardiac in etiology, please see discussion below.  Obtain blood and sputum cultures.  Monitor closely in ICU for further respiratory compromise requiring intubation in the next day or so.  Will also order IR to do thoracentesis in am with appropriate studies to evaluate effusion, will hold anticoagulants for  this reason.  Check HIV and obtain pulmonary consult with dr Juanetta Gosling in am.   Active Problems:   ARF (acute respiratory failure) (HCC)- in setting of nml wbc, nml lactic , swelling in his legs will also do cardiac evaluation.  Check trop, bnp and cardiac echo in am.   SOB (shortness of breath)- noted   Chest pain- noted   Pleural effusion on right- order thoracentesis in am  Admit to icu.  Full code.  Caralee Morea A 02/22/2016, 2:38 AM

## 2016-02-22 NOTE — Progress Notes (Signed)
eLink Physician-Brief Progress Note Patient Name: [name] SplinterJames [name] DOB: 1976-06-20 MRN: [id number]   Date of Service  02/22/2016  HPI/Events of Note  Admit with CAP, rt effusion, due for thoracentesis in AM. Stable on camera check  eICU Interventions  No eICU interventions     Intervention Category Evaluation Type: New Patient Evaluation  Wilma Wuthrich 02/22/2016, 3:47 AM

## 2016-02-22 NOTE — Progress Notes (Signed)
ANTIBIOTIC CONSULT NOTE-Preliminary  Pharmacy Consult for Vancomycin  Indication:Pneumonia  No Known Allergies  Patient Measurements: Height: 6\' 1"  (185.4 cm) Weight: 237 lb 3.4 oz (107.6 kg) IBW/kg (Calculated) : 79.9  Vital Signs: Temp: 97.4 F (36.3 C) (04/18 0319) Temp Source: Oral (04/18 0319) BP: 122/84 mmHg (04/18 0234) Pulse Rate: 116 (04/18 0234)  Labs:  Recent Labs  02/22/16 0046  WBC 11.2*  HGB 13.2  PLT 140*  CREATININE 1.05    Estimated Creatinine Clearance: 124 mL/min (by C-G formula based on Cr of 1.05).  No results for input(s): VANCOTROUGH, VANCOPEAK, VANCORANDOM, GENTTROUGH, GENTPEAK, GENTRANDOM, TOBRATROUGH, TOBRAPEAK, TOBRARND, AMIKACINPEAK, AMIKACINTROU, AMIKACIN in the last 72 hours.   Microbiology: No results found for this or any previous visit (from the past 720 hour(s)).  Medical History: History reviewed. No pertinent past medical history.  Medications:  Ceftriaxone 1 Gm IV x 1 dose in the ED Azithromycin 500 mg IV x 1 dose in the ED  Assessment: 40 yo male with injury to right shoulder @1  week ago; now pt reports SOB, cough and has elevated WBCs. CT of chest shows  pleural effusion and possible atelectasis or pneumonia. Empiric antibiotics.  Goal of Therapy:  Vancomycin troughs 15-20 mcg/ml  Plan:  Preliminary review of pertinent patient information completed.  Protocol will be initiated with a one-time dose of Vancomycin 2000 mg IV.  Jeani HawkingAnnie Penn clinical pharmacist will complete review during morning rounds to assess patient and finalize treatment regimen.  Arelia SneddonMason, Lajeana Strough Anne, [facility] HospitalRPH 02/22/2016,3:34 AM

## 2016-02-22 NOTE — Progress Notes (Signed)
Thoracentesis complete no signs of distress. 180 ml yellow colored pleural fluid removed.

## 2016-02-22 NOTE — Progress Notes (Signed)
[facility] PROGRESS NOTE  [name] Snow ZOX:[id number] DOB: 12/20/1975    PCP:   No primary care provider on file.   HPI: [name] Snow is an 40 y.o. male admitted for CAP, right pleural effusion, right shoulder pain.  He was given IV Rocephin and ZIthromax, and was planned for thoracocentesis today.  Pulmonary consultation was requested, and Dr Juanetta Gosling has seen him.  He does have some pedal edema, and ECHO has been order.  He is SOB, but maintained stable hemodynamics.    Rewiew of Systems:  Constitutional: Negative for malaise, fever and chills. No significant weight loss or weight gain Eyes: Negative for eye pain, redness and discharge, diplopia, visual changes, or flashes of light. ENMT: Negative for ear pain, hoarseness, nasal congestion, sinus pressure and sore throat. No headaches; tinnitus, drooling, or problem swallowing. Cardiovascular: Negative for chest pain, palpitations, diaphoresis, dyspnea and peripheral edema. ; No orthopnea, PND Respiratory: Negative for cough, hemoptysis, wheezing and stridor. No pleuritic chestpain. Gastrointestinal: Negative for nausea, vomiting, diarrhea, constipation, abdominal pain, melena, blood in stool, hematemesis, jaundice and rectal bleeding.    Genitourinary: Negative for frequency, dysuria, incontinence,flank pain and hematuria; Musculoskeletal: Negative for back pain and neck pain. Negative for swelling and trauma.;  Skin: . Negative for pruritus, rash, abrasions, bruising and skin lesion.; ulcerations Neuro: Negative for headache, lightheadedness and neck stiffness. Negative for weakness, altered level of consciousness , altered mental status, extremity weakness, burning feet, involuntary movement, seizure and syncope.  Psych: negative for anxiety, depression, insomnia, tearfulness, panic attacks, hallucinations, paranoia, suicidal or homicidal ideation    History reviewed. No pertinent past medical history.  History  reviewed. No pertinent past surgical history.  Medications:  HOME MEDS: Prior to Admission medications   Medication Sig Start Date End Date Taking? Authorizing Provider  cyclobenzaprine (FLEXERIL) 10 MG tablet Take 1 tablet (10 mg total) by mouth 2 (two) times daily as needed for muscle spasms. 02/19/16  Yes Marily Memos, MD  ibuprofen (ADVIL,MOTRIN) 800 MG tablet Take 1 tablet (800 mg total) by mouth every 8 (eight) hours as needed for moderate pain. 02/19/16  Yes Marily Memos, MD  oxyCODONE-acetaminophen (PERCOCET/ROXICET) 5-325 MG tablet Take 2 tablets by mouth every 4 (four) hours as needed for severe pain. Patient not taking: Reported on 02/21/2016 02/19/16   Marily Memos, MD     Allergies:  No Known Allergies  Social History:   reports that he has been smoking Cigarettes.  He does not have any smokeless tobacco history on file. He reports that he drinks alcohol. He reports that he does not use illicit drugs.  Family History: History reviewed. No pertinent family history.   Physical Exam: Filed Vitals:   02/22/16 0530 02/22/16 0600 02/22/16 0610 02/22/16 0700  BP:   126/77 110/77  Pulse: 109 101 104 107  Temp:      TempSrc:      Resp: Height:      Weight:      SpO2: 96% 94% 96% 95%   Blood pressure 110/77, pulse 107, temperature 97 F (36.1 C), temperature source Axillary, resp. rate 18, height  (1.854 m), weight 107.6 kg (237 lb 3.4 oz), SpO2 95 %.  GEN:  Pleasant  patient lying in the stretcher in no acute distress; cooperative with exam. PSYCH:  alert and oriented x4; does not appear anxious or depressed; affect is appropriate. HEENT: Mucous membranes pink and anicteric; PERRLA; EOM intact; no cervical lymphadenopathy nor thyromegaly or carotid  bruit; no JVD; There were no stridor. Neck is very supple. Breasts:: Not examined CHEST WALL: No tenderness CHEST: Normal respiration, no wheezing or rales, decreased BS on the right. HEART: Regular rate and  rhythm.  There are no murmur, rub, or gallops.   BACK: No kyphosis or scoliosis; no CVA tenderness ABDOMEN: soft and non-tender; no masses, no organomegaly, normal abdominal bowel sounds; no pannus; no intertriginous candida. There is no rebound and no distention. Rectal Exam: Not done EXTREMITIES: No bone or joint deformity; age-appropriate arthropathy of the hands and knees; no edema; no ulcerations.  There is no calf tenderness. Genitalia: not examined PULSES: 2+ and symmetric SKIN: Normal hydration no rash or ulceration CNS: Cranial nerves 2-12 grossly intact no focal lateralizing neurologic deficit.  Speech is fluent; uvula elevated with phonation, facial symmetry and tongue midline. DTR are normal bilaterally, cerebella exam is intact, barbinski is negative and strengths are equaled bilaterally.  No sensory loss.   Labs on Admission:  Basic Metabolic Panel:  Recent Labs Lab 02/22/16 0046 02/22/16 0335  NA 135 136  K 3.9 3.7  CL 104 106  CO2 22 21*  GLUCOSE 113* 123*  BUN 17 17  CREATININE 1.05 1.01  CALCIUM 8.0* 8.2*   Liver Function Tests:  Recent Labs Lab 02/22/16 0046 02/22/16 0335  AST 19 17  ALT 41 38  ALKPHOS 57 56  BILITOT 2.2* 1.6*  PROT 6.8 6.4*  ALBUMIN 3.5 3.4*   CBC:  Recent Labs Lab 02/22/16 0046 02/22/16 0329  WBC 11.2* 11.1*  NEUTROABS 9.2* 9.3*  HGB 13.2 12.6*  HCT 37.7* 36.3*  MCV 87.9 87.7  PLT 140* 143*   Cardiac Enzymes:  Recent Labs Lab 02/22/16 0046  TROPONINI <0.03    CBG: No results for input(s): GLUCAP in the last 168 hours.   Radiological Exams on Admission: Dg Ribs Unilateral W/chest Right  02/22/2016  CLINICAL DATA:  Shortness of breath. Increasing right chest and rib pain. Diminished breath sounds on the right. EXAM: RIGHT RIBS AND CHEST - 3+ VIEW COMPARISON:  None. FINDINGS: Complete opacification of the lower 1/2-2/3 of right hemithorax. This likely represents a combination of pleural fluid atelectasis or  consolidation. Questionable trace left pleural effusion. Heart is normal in size, partially obscured by right hemithorax opacity. No pulmonary edema. No pneumothorax. No fracture or acute bony abnormality of the right ribs. IMPRESSION: 1. Opacification of the lower 1/2 of right hemithorax, likely combination of pleural fluid and atelectasis or consolidation. No pneumothorax or acute rib fracture is seen. 2. Question small left pleural effusion. Electronically Signed   By: Rubye Oaks M.D.   On: 02/22/2016 00:33   Ct Chest Wo Contrast  02/22/2016  CLINICAL DATA:  Right shoulder pain radiating to the right upper back for 1 week. Patient injured the right shoulder while pulling on patellar. Cough today. Shortness of breath. Follow-up of abnormal chest x-ray. EXAM: CT CHEST WITHOUT CONTRAST TECHNIQUE: Multidetector CT imaging of the chest was performed following the standard protocol without IV contrast. COMPARISON:  Chest radiograph 02/22/2016 FINDINGS: Normal heart size. Mild pericardial thickening. Normal caliber thoracic aorta. No significant lymphadenopathy in the chest. Esophagus is mostly decompressed. Evaluation of lungs is limited due to respiratory motion artifact. There is a moderate-sized right pleural effusion with consolidation in the right lung base. Fluid in the fissures. Changes are nonspecific but could indicate pneumonia. Emphysematous changes in the lungs. Airways appear patent. Included portions of the upper abdominal organs demonstrate no gross abnormality in the  unenhanced imaging. Degenerative changes in the spine. No destructive bone lesions. Ribs appear intact. IMPRESSION: Moderate size right pleural effusion with basilar consolidation. Appearance is nonspecific but could indicate pneumonia. Followup PA and lateral chest X-ray is recommended in 3-4 weeks following trial of antibiotic therapy to ensure resolution and exclude underlying malignancy. Electronically Signed   By: Burman NievesWilliam   Stevens M.D.   On: 02/22/2016 02:03    EKG: Independently reviewed.   Assessment/Plan Present on Admission:  . CAP (community acquired pneumonia) . ARF (acute respiratory failure) (HCC) . SOB (shortness of breath) . Chest pain . Pleural effusion on right  PLAN:  CAP with large right pleural effusion:  Possible parapneumonic pleural effusion.  Will obtain right thoracocentesis today.  I will order urinary Legionella antigen.  Check HIV status.  Continue with IV antibiotics.  Keep in ICU.  WIll give supplemental oxygen.   Other plans as per orders.  Code Status:FULL CODE.    Houston SirenLE,Zakkiyya Barno, MD.  FACP [facility] Pager [phone] 7pm to 7am.  02/22/2016, 8:08 AM

## 2016-02-22 NOTE — Procedures (Signed)
PreOperative Dx: Loculated RT pleural effusion Postoperative Dx: Loculated RT pleural effusion Procedure:   US guided diagnostic RT thoracentesis Radiologist:  Tyron RussellBoles Anesthesia:  10 ml of 1% lidocaine, 5 ml of 2% lidocaine Specimen:  180 ml of yellow colored fluid EBL:   < 1 ml Complications: None

## 2016-02-22 NOTE — Consult Note (Signed)
Consult requested [facility] hospitalists Consult requested for abnormal chest CT:  HPI: This is a 40 year old who says he was in his usual state of health until about 4 days prior to admission. He thought he had hurt his right shoulder that he developed right-sided chest pain. He got to the point that he could not breathe lying down so he's been standing to sleep for the 3 days prior to admission. He has been coughing and coughing up some sputum. He smokes about a package of cigarettes daily. No other chest pain. He has noticed swelling of his legs. He does have a positive family history of COPD. No personal history of COPD and he has been told that he stops breathing during sleep but this has not been evaluated  History reviewed. No pertinent past medical history.   History reviewed. No pertinent family history.   Social History   Social History  . Marital Status: Single    Spouse Name: N/A  . Number of Children: N/A  . Years of Education: N/A   Social History Main Topics  . Smoking status: Current Every Day Smoker    Types: Cigarettes  . Smokeless tobacco: None  . Alcohol Use: Yes     Comment: occ  . Drug Use: No  . Sexual Activity: Not Asked   Other Topics Concern  . None   Social History Narrative     ROS: As mentioned has swelling. He does not have hemoptysis. No cardiac chest pain. He does have PND and orthopnea as mentioned. The rest per the history and physical    Objective: Vital signs in last 24 hours: Temp:  [97 F (36.1 C)-97.5 F (36.4 C)] 97 F (36.1 C) (04/18 0813) Pulse Rate:  [100-125] 107 (04/18 0700) Resp:  [18-34] 18 (04/18 0700) BP: (110-145)/(68-92) 110/77 mmHg (04/18 0700) SpO2:  [91 %-97 %] 95 % (04/18 0700) FiO2 (%):  [0 %] 0 % (04/18 0308) Weight:  [107.6 kg (237 lb 3.4 oz)-109.77 kg (242 lb)] 107.6 kg (237 lb 3.4 oz) (04/18 0328) Weight change:  Last BM Date: 02/21/16  Intake/Output from previous day: 04/17 0701 - 04/18 0700 In: 865  [P.O.:240; I.V.:225; IV Piggyback:400] Out: 1200 [Urine:1200]  PHYSICAL EXAM He is awake and alert. He looks acutely sick. He is coughing during the examination. Respiratory rates about 25 at rest. His pupils are reactive nose and throat are clear mucous membranes are moist his neck is supple without masses. His chest shows markedly diminished breath sounds on the right his heart is regular without gallop and a rate of about 100. His abdomen is soft without masses. Extremities showed 1+ edema of his feet. Central nervous system exam grossly intact  Lab Results: Basic Metabolic Panel:  Recent Labs  45/40/98 0046 02/22/16 0335  NA 135 136  K 3.9 3.7  CL 104 106  CO2 22 21*  GLUCOSE 113* 123*  BUN 17 17  CREATININE 1.05 1.01  CALCIUM 8.0* 8.2*   Liver Function Tests:  Recent Labs  02/22/16 0046 02/22/16 0335  AST 19 17  ALT 41 38  ALKPHOS 57 56  BILITOT 2.2* 1.6*  PROT 6.8 6.4*  ALBUMIN 3.5 3.4*   No results for input(s): LIPASE, AMYLASE in the last 72 hours. No results for input(s): AMMONIA in the last 72 hours. CBC:  Recent Labs  02/22/16 0046 02/22/16 0329  WBC 11.2* 11.1*  NEUTROABS 9.2* 9.3*  HGB 13.2 12.6*  HCT 37.7* 36.3*  MCV 87.9 87.7  PLT 140*  143*   Cardiac Enzymes:  Recent Labs  02/22/16 0046  TROPONINI <0.03   BNP: No results for input(s): PROBNP in the last 72 hours. D-Dimer: No results for input(s): DDIMER in the last 72 hours. CBG: No results for input(s): GLUCAP in the last 72 hours. Hemoglobin A1C: No results for input(s): HGBA1C in the last 72 hours. Fasting Lipid Panel: No results for input(s): CHOL, HDL, LDLCALC, TRIG, CHOLHDL, LDLDIRECT in the last 72 hours. Thyroid Function Tests:  Recent Labs  02/22/16 0335  TSH 1.875   Anemia Panel: No results for input(s): VITAMINB12, FOLATE, FERRITIN, TIBC, IRON, RETICCTPCT in the last 72 hours. Coagulation: No results for input(s): LABPROT, INR in the last 72 hours. Urine Drug  Screen: Drugs of Abuse  No results found for: LABOPIA, COCAINSCRNUR, LABBENZ, AMPHETMU, THCU, LABBARB  Alcohol Level: No results for input(s): ETH in the last 72 hours. Urinalysis:  Recent Labs  02/22/16 0400  COLORURINE YELLOW  LABSPEC 1.010  PHURINE 5.5  GLUCOSEU NEGATIVE  HGBUR NEGATIVE  BILIRUBINUR NEGATIVE  KETONESUR NEGATIVE  PROTEINUR NEGATIVE  NITRITE NEGATIVE  LEUKOCYTESUR NEGATIVE   Misc. Labs:   ABGS:  Recent Labs  02/22/16 0235  PHART 7.419  PO2ART 59.8*  HCO3 22.9     MICROBIOLOGY: Recent Results (from the past 240 hour(s))  Culture, blood (Routine X 2) w Reflex to ID Panel     Status: None (Preliminary result)   Collection Time: 02/22/16  3:00 AM  Result Value Ref Range Status   Specimen Description BLOOD RIGHT ANTECUBITAL  Final   Special Requests   Final    BOTTLES DRAWN AEROBIC AND ANAEROBIC AEB 10CC ANA 6CC   Culture PENDING  Incomplete   Report Status PENDING  Incomplete  MRSA PCR Screening     Status: None   Collection Time: 02/22/16  3:25 AM  Result Value Ref Range Status   MRSA by PCR NEGATIVE NEGATIVE Final    Comment:        The GeneXpert MRSA Assay (FDA approved for NASAL specimens only), is one component of a comprehensive MRSA colonization surveillance program. It is not intended to diagnose MRSA infection nor to guide or monitor treatment for MRSA infections.   Culture, blood (Routine X 2) w Reflex to ID Panel     Status: None (Preliminary result)   Collection Time: 02/22/16  3:35 AM  Result Value Ref Range Status   Specimen Description BLOOD LEFT HAND  Final   Special Requests   Final    BOTTLES DRAWN AEROBIC AND ANAEROBIC AEB 12CC ANA 8CC   Culture PENDING  Incomplete   Report Status PENDING  Incomplete    Studies/Results: Dg Ribs Unilateral W/chest Right  02/22/2016  CLINICAL DATA:  Shortness of breath. Increasing right chest and rib pain. Diminished breath sounds on the right. EXAM: RIGHT RIBS AND CHEST - 3+ VIEW  COMPARISON:  None. FINDINGS: Complete opacification of the lower 1/2-2/3 of right hemithorax. This likely represents a combination of pleural fluid atelectasis or consolidation. Questionable trace left pleural effusion. Heart is normal in size, partially obscured by right hemithorax opacity. No pulmonary edema. No pneumothorax. No fracture or acute bony abnormality of the right ribs. IMPRESSION: 1. Opacification of the lower 1/2 of right hemithorax, likely combination of pleural fluid and atelectasis or consolidation. No pneumothorax or acute rib fracture is seen. 2. Question small left pleural effusion. Electronically Signed   By: Rubye Oaks M.D.   On: 02/22/2016 00:33   Ct Chest Wo Contrast  02/22/2016  CLINICAL DATA:  Right shoulder pain radiating to the right upper back for 1 week. Patient injured the right shoulder while pulling on patellar. Cough today. Shortness of breath. Follow-up of abnormal chest x-ray. EXAM: CT CHEST WITHOUT CONTRAST TECHNIQUE: Multidetector CT imaging of the chest was performed following the standard protocol without IV contrast. COMPARISON:  Chest radiograph 02/22/2016 FINDINGS: Normal heart size. Mild pericardial thickening. Normal caliber thoracic aorta. No significant lymphadenopathy in the chest. Esophagus is mostly decompressed. Evaluation of lungs is limited due to respiratory motion artifact. There is a moderate-sized right pleural effusion with consolidation in the right lung base. Fluid in the fissures. Changes are nonspecific but could indicate pneumonia. Emphysematous changes in the lungs. Airways appear patent. Included portions of the upper abdominal organs demonstrate no gross abnormality in the unenhanced imaging. Degenerative changes in the spine. No destructive bone lesions. Ribs appear intact. IMPRESSION: Moderate size right pleural effusion with basilar consolidation. Appearance is nonspecific but could indicate pneumonia. Followup PA and lateral chest X-ray  is recommended in 3-4 weeks following trial of antibiotic therapy to ensure resolution and exclude underlying malignancy. Electronically Signed   By: Burman NievesWilliam  Stevens M.D.   On: 02/22/2016 02:03    Medications:  Prior to Admission:  Prescriptions prior to admission  Medication Sig Dispense Refill Last Dose  . cyclobenzaprine (FLEXERIL) 10 MG tablet Take 1 tablet (10 mg total) by mouth 2 (two) times daily as needed for muscle spasms. 20 tablet 0 02/21/2016 at Unknown time  . ibuprofen (ADVIL,MOTRIN) 800 MG tablet Take 1 tablet (800 mg total) by mouth every 8 (eight) hours as needed for moderate pain. 21 tablet 0 02/21/2016 at Unknown time  . oxyCODONE-acetaminophen (PERCOCET/ROXICET) 5-325 MG tablet Take 2 tablets by mouth every 4 (four) hours as needed for severe pain. (Patient not taking: Reported on 02/21/2016) 6 tablet 0    Scheduled: . azithromycin  500 mg Intravenous Q24H  . cefTRIAXone (ROCEPHIN)  IV  1 g Intravenous Q24H   Continuous: . sodium chloride 75 mL/hr at 02/22/16 0600   PRN:  Assesment:He was admitted with community-acquired pneumonia a large right pleural effusion and acute respiratory failure. Concern would be if he has empyema. He has symptoms suggesting heart failure but it could be related to his pneumonia. Principal Problem:   CAP (community acquired pneumonia) Active Problems:   ARF (acute respiratory failure) (HCC)   SOB (shortness of breath)   Chest pain   Pleural effusion on right    Plan: Agree with plans for thoracentesis. I've ordered labs from that. Continue with IV antibiotics. Echocardiogram has been ordered.    LOS: 0 days   Renesme Kerrigan L 02/22/2016, 8:48 AM

## 2016-02-23 ENCOUNTER — Inpatient Hospital Stay (HOSPITAL_COMMUNITY): Payer: MEDICAID

## 2016-02-23 ENCOUNTER — Inpatient Hospital Stay (HOSPITAL_COMMUNITY): Payer: Self-pay

## 2016-02-23 DIAGNOSIS — R071 Chest pain on breathing: Secondary | ICD-10-CM

## 2016-02-23 DIAGNOSIS — J869 Pyothorax without fistula: Secondary | ICD-10-CM

## 2016-02-23 DIAGNOSIS — J948 Other specified pleural conditions: Secondary | ICD-10-CM

## 2016-02-23 DIAGNOSIS — J189 Pneumonia, unspecified organism: Secondary | ICD-10-CM

## 2016-02-23 DIAGNOSIS — J9601 Acute respiratory failure with hypoxia: Secondary | ICD-10-CM

## 2016-02-23 LAB — STREP PNEUMONIAE URINARY ANTIGEN: Strep Pneumo Urinary Antigen: NEGATIVE

## 2016-02-23 LAB — URINALYSIS, ROUTINE W REFLEX MICROSCOPIC
BILIRUBIN URINE: NEGATIVE
Glucose, UA: NEGATIVE mg/dL
HGB URINE DIPSTICK: NEGATIVE
Ketones, ur: 40 mg/dL — AB
Leukocytes, UA: NEGATIVE
Nitrite: NEGATIVE
Protein, ur: 30 mg/dL — AB
SPECIFIC GRAVITY, URINE: 1.022 (ref 1.005–1.030)
pH: 7 (ref 5.0–8.0)

## 2016-02-23 LAB — POCT I-STAT 3, ART BLOOD GAS (G3+)
Acid-Base Excess: 2 mmol/L (ref 0.0–2.0)
Acid-Base Excess: 2 mmol/L (ref 0.0–2.0)
BICARBONATE: 28 meq/L — AB (ref 20.0–24.0)
BICARBONATE: 27.3 meq/L — AB (ref 20.0–24.0)
Bicarbonate: 27.8 mEq/L — ABNORMAL HIGH (ref 20.0–24.0)
O2 Saturation: 98 %
O2 Saturation: 99 %
O2 Saturation: 99 %
PCO2 ART: 46.5 mmHg — AB (ref 35.0–45.0)
PCO2 ART: 64.1 mmHg — AB (ref 35.0–45.0)
PCO2 ART: 46.6 mmHg — AB (ref 35.0–45.0)
PH ART: 7.385 (ref 7.350–7.450)
PO2 ART: 105 mmHg — AB (ref 80.0–100.0)
PO2 ART: 189 mmHg — AB (ref 80.0–100.0)
PO2 ART: 128 mmHg — AB (ref 80.0–100.0)
Patient temperature: 102.3
Patient temperature: 102
TCO2: 29 mmol/L (ref 0–100)
TCO2: 30 mmol/L (ref 0–100)
TCO2: 29 mmol/L (ref 0–100)
pH, Arterial: 7.258 — ABNORMAL LOW (ref 7.350–7.450)
pH, Arterial: 7.384 (ref 7.350–7.450)

## 2016-02-23 LAB — BASIC METABOLIC PANEL
ANION GAP: 7 (ref 5–15)
BUN: 15 mg/dL (ref 6–20)
CO2: 26 mmol/L (ref 22–32)
Calcium: 8.1 mg/dL — ABNORMAL LOW (ref 8.9–10.3)
Chloride: 103 mmol/L (ref 101–111)
Creatinine, Ser: 0.81 mg/dL (ref 0.61–1.24)
GFR calc Af Amer: >60 mL/min (ref >60)
Glucose, Bld: 114 mg/dL — ABNORMAL HIGH (ref 65–99)
POTASSIUM: 3.9 mmol/L (ref 3.5–5.1)
SODIUM: 136 mmol/L (ref 135–145)

## 2016-02-23 LAB — CBC
HCT: 36.2 % — ABNORMAL LOW (ref 39.0–52.0)
Hemoglobin: 12.2 g/dL — ABNORMAL LOW (ref 13.0–17.0)
MCH: 30.1 pg (ref 26.0–34.0)
MCHC: 33.7 g/dL (ref 30.0–36.0)
MCV: 89.4 fL (ref 78.0–100.0)
PLATELETS: 155 10*3/uL (ref 150–400)
RBC: 4.05 MIL/uL — AB (ref 4.22–5.81)
RDW: 14.6 % (ref 11.5–15.5)
WBC: 9.5 10*3/uL (ref 4.0–10.5)

## 2016-02-23 LAB — BLOOD GAS, ARTERIAL
ACID-BASE EXCESS: 0.8 mmol/L (ref 0.0–2.0)
Bicarbonate: 24.8 mEq/L — ABNORMAL HIGH (ref 20.0–24.0)
DRAWN BY: 419771
O2 Content: 4 L/min
O2 SAT: 94.8 %
pCO2 arterial: 43.8 mmHg (ref 35.0–45.0)
pH, Arterial: 7.38 (ref 7.350–7.450)
pO2, Arterial: 76.7 mmHg — ABNORMAL LOW (ref 80.0–100.0)

## 2016-02-23 LAB — URINE MICROSCOPIC-ADD ON

## 2016-02-23 LAB — HIV ANTIBODY (ROUTINE TESTING W REFLEX): HIV Screen 4th Generation wRfx: NONREACTIVE

## 2016-02-23 LAB — TRIGLYCERIDES: Triglycerides: 150 mg/dL — ABNORMAL HIGH (ref <150)

## 2016-02-23 LAB — GLUCOSE, CAPILLARY
GLUCOSE-CAPILLARY: 98 mg/dL (ref 65–99)
Glucose-Capillary: 104 mg/dL — ABNORMAL HIGH (ref 65–99)

## 2016-02-23 LAB — PH, BODY FLUID: PH, BODY FLUID: 7.6

## 2016-02-23 LAB — LEGIONELLA PNEUMOPHILA SEROGP 1 UR AG: L. PNEUMOPHILA SEROGP 1 UR AG: NEGATIVE

## 2016-02-23 LAB — PROCALCITONIN: Procalcitonin: 0.54 ng/mL

## 2016-02-23 MED ORDER — FENTANYL CITRATE (PF) 100 MCG/2ML IJ SOLN
100.0000 ug | INTRAMUSCULAR | Status: DC | PRN
  Administered 2016-02-23 (×2): 100 ug via INTRAVENOUS
  Filled 2016-02-23: qty 2

## 2016-02-23 MED ORDER — PIPERACILLIN-TAZOBACTAM 3.375 G IVPB 30 MIN
3.3750 g | Freq: Once | INTRAVENOUS | Status: AC
  Administered 2016-02-23: 3.375 g via INTRAVENOUS
  Filled 2016-02-23: qty 50

## 2016-02-23 MED ORDER — ROCURONIUM BROMIDE 50 MG/5ML IV SOLN
30.0000 mg | Freq: Once | INTRAVENOUS | Status: AC
  Administered 2016-02-23: 30 mg via INTRAVENOUS

## 2016-02-23 MED ORDER — DEXTROSE-NACL 5-0.45 % IV SOLN
INTRAVENOUS | Status: DC
  Administered 2016-02-23: via INTRAVENOUS
  Administered 2016-02-24: 50 mL/h via INTRAVENOUS

## 2016-02-23 MED ORDER — ANTISEPTIC ORAL RINSE SOLUTION (CORINZ): 7.0000 mL | Freq: Four times a day (QID) | OROMUCOSAL | Status: DC

## 2016-02-23 MED ORDER — ACETAMINOPHEN 160 MG/5ML PO SOLN
650.0000 mg | Freq: Four times a day (QID) | ORAL | Status: DC | PRN
  Administered 2016-02-23: 650 mg
  Filled 2016-02-23: qty 20.3

## 2016-02-23 MED ORDER — DEXTROSE 5 % IV SOLN
2.0000 g | INTRAVENOUS | Status: DC
  Filled 2016-02-23: qty 2

## 2016-02-23 MED ORDER — FENTANYL CITRATE (PF) 100 MCG/2ML IJ SOLN
INTRAMUSCULAR | Status: AC
  Administered 2016-02-23: 100 ug via INTRAVENOUS
  Filled 2016-02-23: qty 4

## 2016-02-23 MED ORDER — CHLORHEXIDINE GLUCONATE 0.12% ORAL RINSE (MEDLINE KIT): 15.0000 mL | Freq: Two times a day (BID) | OROMUCOSAL | Status: DC

## 2016-02-23 MED ORDER — FAMOTIDINE IN NACL 20-0.9 MG/50ML-% IV SOLN
20.0000 mg | Freq: Two times a day (BID) | INTRAVENOUS | Status: DC
  Administered 2016-02-24 – 2016-02-27 (×8): 20 mg via INTRAVENOUS
  Filled 2016-02-23 (×12): qty 50

## 2016-02-23 MED ORDER — PROPOFOL 1000 MG/100ML IV EMUL
0.0000 ug/kg/min | INTRAVENOUS | Status: DC
  Administered 2016-02-23: 40 ug/kg/min via INTRAVENOUS
  Administered 2016-02-23: 10 ug/kg/min via INTRAVENOUS
  Administered 2016-02-24 (×2): 40 ug/kg/min via INTRAVENOUS
  Administered 2016-02-24: 50 ug/kg/min via INTRAVENOUS
  Administered 2016-02-24 (×2): 40 ug/kg/min via INTRAVENOUS
  Administered 2016-02-24 – 2016-02-25 (×3): 50 ug/kg/min via INTRAVENOUS
  Filled 2016-02-23 (×9): qty 100
  Filled 2016-02-23: qty 200

## 2016-02-23 MED ORDER — MIDAZOLAM HCL 2 MG/2ML IJ SOLN
INTRAMUSCULAR | Status: AC
Start: 2016-02-23 — End: 2016-02-23
  Administered 2016-02-23: 20:00:00
  Administered 2016-02-23: 2 mg
  Filled 2016-02-23: qty 4

## 2016-02-23 MED ORDER — MIDAZOLAM HCL 2 MG/2ML IJ SOLN
INTRAMUSCULAR | Status: AC
  Administered 2016-02-23: 2 mg
  Filled 2016-02-23: qty 2

## 2016-02-23 MED ORDER — CHLORHEXIDINE GLUCONATE 0.12% ORAL RINSE (MEDLINE KIT)
15.0000 mL | Freq: Two times a day (BID) | OROMUCOSAL | Status: DC
  Administered 2016-02-23 – 2016-02-26 (×7): 15 mL via OROMUCOSAL

## 2016-02-23 MED ORDER — FENTANYL CITRATE (PF) 100 MCG/2ML IJ SOLN
INTRAMUSCULAR | Status: AC
  Administered 2016-02-23: 100 ug via INTRAVENOUS
  Filled 2016-02-23: qty 2

## 2016-02-23 MED ORDER — ETOMIDATE 2 MG/ML IV SOLN
40.0000 mg | Freq: Once | INTRAVENOUS | Status: AC
  Administered 2016-02-23: 40 mg via INTRAVENOUS

## 2016-02-23 MED ORDER — FENTANYL CITRATE (PF) 100 MCG/2ML IJ SOLN
100.0000 ug | INTRAMUSCULAR | Status: DC | PRN
  Administered 2016-02-23 – 2016-02-25 (×9): 100 ug via INTRAVENOUS
  Filled 2016-02-23 (×8): qty 2

## 2016-02-23 MED ORDER — PIPERACILLIN-TAZOBACTAM 3.375 G IVPB
3.3750 g | Freq: Three times a day (TID) | INTRAVENOUS | Status: DC
  Administered 2016-02-24 – 2016-03-01 (×18): 3.375 g via INTRAVENOUS
  Filled 2016-02-23 (×23): qty 50

## 2016-02-23 MED ORDER — ANTISEPTIC ORAL RINSE SOLUTION (CORINZ)
7.0000 mL | Freq: Four times a day (QID) | OROMUCOSAL | Status: DC
  Administered 2016-02-24 – 2016-02-26 (×12): 7 mL via OROMUCOSAL

## 2016-02-23 MED ORDER — VANCOMYCIN HCL IN DEXTROSE 1-5 GM/200ML-% IV SOLN
1000.0000 mg | Freq: Three times a day (TID) | INTRAVENOUS | Status: DC
  Administered 2016-02-23 – 2016-02-24 (×2): 1000 mg via INTRAVENOUS
  Filled 2016-02-23 (×6): qty 200

## 2016-02-23 NOTE — Progress Notes (Signed)
Order received per Dr. Juanetta GoslingHawkins [facility]. [facility]. Pt IV flushed without issue, saline locked. O2 saturation 98-100% maintained on non-rebreather oxygen mask. Pt left unit with [facility] personnel in stable condition.

## 2016-02-23 NOTE — Progress Notes (Signed)
Critical ABG result called to Resident Dr. Andrey CampanileWilson.

## 2016-02-23 NOTE — Progress Notes (Signed)
PROGRESS NOTE    [name] SplinterJames [name]  ZOX:[id number] DOB: 08/28/1976 DOA: 02/21/2016 PCP: No primary care provider on file.  Outpatient Specialists:    Brief Narrative: [id number] yom admitted for CAP, right pleural effusion, acute respiratory failure and right shoulder pain. He was started on IV rocephin and Zithromax, with thoracentesis performed 4/18 without complications. It was noted that pleural effusion was loculated and pleural fluid appeared to be exudative. Due to concern for underlying empyema, case was discussed with TCTS who felt pt will likely need VATS. He is now on HFNC and CPAP nightly.  [facility]    Assessment & Plan: 1. CAP started on IV abx on admission. Mild fever overnight has resolved with mild leukocytosis. Lactic acid normal 2. Acute respiratory failure, improving. On admission placed on BiPAP. Has been weaned to HFNC but still very dyspneic.  3. Loculated pleural effusion, right. Thoracentesis 4/18 with removal of 180ml of fluids. Dr. Juanetta GoslingHawkins following and discussed the case with Dr. Sabino NiemannVan Trigh. It was felt he will likely need a VATS procedure for further treatment. [facility]. He is currently NPO    Principal Problem:   CAP (community acquired pneumonia) Active Problems:   ARF (acute respiratory failure) (HCC)   SOB (shortness of breath)   Chest pain   Pleural effusion on right   DVT prophylaxis: SCDs Code Status: Full Family Communication: Wife bedside  [facility] Plan[facility]    Consultants:   Pulmonology    Procedures:   Thoracentesis 4/18  Antimicrobials:   Zithromax 4/18>>4/25  Rocephin 4/18>>4/25   Subjective: Breathing is improving. Mild cough but otherwise dong fine.   Objective: Filed Vitals:   02/23/16 0240 02/23/16 0300 02/23/16 0400 02/23/16 0500  BP: 144/78 127/53 124/74 117/48  Pulse: 104 106 103 101  Temp:   98.8 F (37.1 C)   TempSrc:   Axillary   Resp:  27 30 32 32  Height:      Weight:    107.6 kg (237 lb 3.4 oz)  SpO2: 95% 96% 97% 94%    Intake/Output Summary (Last 24 hours) at 02/23/16 0716 Last data filed at 02/23/16 0500  Gross per 24 hour  Intake   1710 ml  Output    825 ml  Net    885 ml   Filed Weights   02/22/16 0319 02/22/16 0328 02/23/16 0500  Weight: 107.6 kg (237 lb 3.4 oz) 107.6 kg (237 lb 3.4 oz) 107.6 kg (237 lb 3.4 oz)    Examination:  General exam: Appears to be dyspneic with increased respiratory effort.   Respiratory system: Diminished breath sounds at the right base with bilateral rhonchi. Increased respiratory efforts. Cardiovascular system: S1 & S2 heard, RRR. No JVD, murmurs, rubs, gallops or clicks. No pedal edema. Gastrointestinal system: Abdomen is nondistended, soft and nontender. No organomegaly or masses felt. Normal bowel sounds heard. Central nervous system: Alert and oriented. No focal neurological deficits. Extremities: Symmetric 5 x 5 power. Skin: No rashes, lesions or ulcers Psychiatry: Judgement and insight appear normal. Mood & affect appropriate.     Data Reviewed: I have personally reviewed following labs and imaging studies  CBC:  Recent Labs Lab 02/22/16 0046 02/22/16 0329  WBC 11.2* 11.1*  NEUTROABS 9.2* 9.3*  HGB 13.2 12.6*  HCT 37.7* 36.3*  MCV 87.9 87.7  PLT 140* 143*   Basic Metabolic Panel:  Recent Labs Lab 02/22/16 0046 02/22/16 0335  NA 135 136  K 3.9 3.7  CL 104 106  CO2 22 21*  GLUCOSE 113* 123*  BUN 17 17  CREATININE 1.05 1.01  CALCIUM 8.0* 8.2*   GFR: Estimated Creatinine Clearance: 128.9 mL/min (by C-G formula based on Cr of 1.01). Liver Function Tests:  Recent Labs Lab 02/22/16 0046 02/22/16 0335  AST 19 17  ALT 41 38  ALKPHOS 57 56  BILITOT 2.2* 1.6*  PROT 6.8 6.4*  ALBUMIN 3.5 3.4*     Recent Labs Lab 02/22/16 0046  TROPONINI <0.03   Thyroid Function Tests:  Recent Labs  02/22/16 0335  TSH 1.875   Urine analysis:      Component Value Date/Time   COLORURINE YELLOW 02/22/2016 0400   APPEARANCEUR CLEAR 02/22/2016 0400   LABSPEC 1.010 02/22/2016 0400   PHURINE 5.5 02/22/2016 0400   GLUCOSEU NEGATIVE 02/22/2016 0400   HGBUR NEGATIVE 02/22/2016 0400   BILIRUBINUR NEGATIVE 02/22/2016 0400   KETONESUR NEGATIVE 02/22/2016 0400   PROTEINUR NEGATIVE 02/22/2016 0400   NITRITE NEGATIVE 02/22/2016 0400   LEUKOCYTESUR NEGATIVE 02/22/2016 0400   Sepsis Labs: (procalcitonin:4,lacticidven:4)  ) Recent Results (from the past 240 hour(s))  Culture, blood (Routine X 2) w Reflex to ID Panel     Status: None (Preliminary result)   Collection Time: 02/22/16  3:00 AM  Result Value Ref Range Status   Specimen Description BLOOD RIGHT ANTECUBITAL  Final   Special Requests   Final    BOTTLES DRAWN AEROBIC AND ANAEROBIC AEB 10CC ANA 6CC   Culture NO GROWTH < 12 HOURS  Final   Report Status PENDING  Incomplete  MRSA PCR Screening     Status: None   Collection Time: 02/22/16  3:25 AM  Result Value Ref Range Status   MRSA by PCR NEGATIVE NEGATIVE Final    Comment:        The GeneXpert MRSA Assay (FDA approved for NASAL specimens only), is one component of a comprehensive MRSA colonization surveillance program. It is not intended to diagnose MRSA infection nor to guide or monitor treatment for MRSA infections.   Culture, blood (Routine X 2) w Reflex to ID Panel     Status: None (Preliminary result)   Collection Time: 02/22/16  3:35 AM  Result Value Ref Range Status   Specimen Description BLOOD LEFT HAND  Final   Special Requests   Final    BOTTLES DRAWN AEROBIC AND ANAEROBIC AEB 12CC ANA 8CC   Culture NO GROWTH < 12 HOURS  Final   Report Status PENDING  Incomplete  Culture, body fluid-bottle     Status: None (Preliminary result)   Collection Time: 02/22/16  8:15 AM  Result Value Ref Range Status   Specimen Description LUNG RIGHT  Final   Special Requests NONE 10CC EACH  Final   Culture PENDING   Incomplete   Report Status PENDING  Incomplete  Gram stain     Status: None (Preliminary result)   Collection Time: 02/22/16  8:15 AM  Result Value Ref Range Status   Specimen Description LUNG RIGHT  Final   Special Requests NONE  Final   Gram Stain   Final    NO ORGANISMS SEEN ABUNDANT WBC PRESENT, PREDOMINANTLY PMN    Report Status PENDING  Incomplete         Radiology Studies: Dg Chest 1 View  02/22/2016  CLINICAL DATA:  Post thoracentesis EXAM: CHEST 1 VIEW COMPARISON:  Earlier today FINDINGS: Persistent large right pleural effusion obscuring the lower lungs. Streaky retrocardiac opacity, mild  atelectasis based on CT. No cardiomegaly when accounting for mediastinal fat pad. No pneumothorax. IMPRESSION: No acute finding after thoracentesis. Large and loculated right pleural effusion suspicious for empyema. Electronically Signed   By: Marnee Spring M.D.   On: 02/22/2016 11:02   Dg Ribs Unilateral W/chest Right  02/22/2016  CLINICAL DATA:  Shortness of breath. Increasing right chest and rib pain. Diminished breath sounds on the right. EXAM: RIGHT RIBS AND CHEST - 3+ VIEW COMPARISON:  None. FINDINGS: Complete opacification of the lower 1/2-2/3 of right hemithorax. This likely represents a combination of pleural fluid atelectasis or consolidation. Questionable trace left pleural effusion. Heart is normal in size, partially obscured by right hemithorax opacity. No pulmonary edema. No pneumothorax. No fracture or acute bony abnormality of the right ribs. IMPRESSION: 1. Opacification of the lower 1/2 of right hemithorax, likely combination of pleural fluid and atelectasis or consolidation. No pneumothorax or acute rib fracture is seen. 2. Question small left pleural effusion. Electronically Signed   By: Rubye Oaks M.D.   On: 02/22/2016 00:33   Ct Chest Wo Contrast  02/22/2016  CLINICAL DATA:  Right shoulder pain radiating to the right upper back for 1 week. Patient injured the right  shoulder while pulling on patellar. Cough today. Shortness of breath. Follow-up of abnormal chest x-ray. EXAM: CT CHEST WITHOUT CONTRAST TECHNIQUE: Multidetector CT imaging of the chest was performed following the standard protocol without IV contrast. COMPARISON:  Chest radiograph 02/22/2016 FINDINGS: Normal heart size. Mild pericardial thickening. Normal caliber thoracic aorta. No significant lymphadenopathy in the chest. Esophagus is mostly decompressed. Evaluation of lungs is limited due to respiratory motion artifact. There is a moderate-sized right pleural effusion with consolidation in the right lung base. Fluid in the fissures. Changes are nonspecific but could indicate pneumonia. Emphysematous changes in the lungs. Airways appear patent. Included portions of the upper abdominal organs demonstrate no gross abnormality in the unenhanced imaging. Degenerative changes in the spine. No destructive bone lesions. Ribs appear intact. IMPRESSION: Moderate size right pleural effusion with basilar consolidation. Appearance is nonspecific but could indicate pneumonia. Followup PA and lateral chest X-ray is recommended in 3-4 weeks following trial of antibiotic therapy to ensure resolution and exclude underlying malignancy. Electronically Signed   By: Burman Nieves M.D.   On: 02/22/2016 02:03   US Thoracentesis Asp Pleural Space W/img Guide  02/22/2016  INDICATION: Loculated RIGHT pleural effusion, lung consolidation EXAM: ULTRASOUND GUIDED DIAGNOSTIC RIGHT THORACENTESIS PROCEDURE: Procedure, benefits, and risks of procedure were discussed with patient. Written informed consent for procedure was obtained. Time out protocol followed. Pleural effusion localized by ultrasound at the posterior RIGHT hemithorax. Fluid appeared mildly complicated and partially loculated. Skin prepped and draped in usual sterile fashion. Skin and soft tissues anesthetized with 10 mL of 1% lidocaine. Due to persistent pain, additional  anesthesia with 5 mL of 2% lidocaine was utilized locally. Five [place] Yueh catheter placed into the RIGHT pleural space. 180 mL of yellow RIGHT pleural fluid was aspirated by syringe pump. Procedure tolerated well by patient without immediate complication. FINDINGS: The fluid sample of 180 mL was sent for laboratory analysis. IMPRESSION: Successful ultrasound guided diagnostic RIGHT thoracentesis yielding 180 mL of pleural fluid. Electronically Signed   By: Ulyses Southward M.D.   On: 02/22/2016 11:39        Scheduled Meds: . azithromycin  500 mg Intravenous Q24H  . cefTRIAXone (ROCEPHIN)  IV  1 g Intravenous Q24H  . ipratropium-albuterol  3 mL Nebulization Q6H  Continuous Infusions:    LOS: 1 day    Time spent: 25 minutes     Erick Blinks, MD [facility] Pager [phone]  If 7PM-7AM, please contact night-coverage [email or web address] Password [email or web address] 02/23/2016, 7:16 AM     By signing my name below, I, Zadie Cleverly, attest that this documentation has been prepared under the direction and in the presence of Erick Blinks, MD. Electronically signed: Zadie Cleverly, Scribe. 02/23/2016 8:53am  I, Dr. Erick Blinks, personally performed the services described in this documentaiton. All medical record entries made by the scribe were at my direction and in my presence. I have reviewed the chart and agree that the record reflects my personal performance and is accurate and complete  Erick Blinks, MD, 02/23/2016 9:16 AM

## 2016-02-23 NOTE — Procedures (Signed)
Intubation Procedure Note [name] [name] [id number] 1976/08/22  Procedure: Intubation Indications: Respiratory insufficiency   Pt comes in with Empyema/Loculated R effusion. With rapid decline the entire day.  Initially was on 4L > NRBM > BiPaP.  When I saw him at 6:50 pm, he was breathing in the 50s, HR 120s, BP 130/80. 100% O2 satn on 100% Fio2.   Procedure Details Consent: Risks of procedure as well as the alternatives and risks of each were explained to the (patient/caregiver).  Consent for procedure obtained. Time Out: Verified patient identification, verified procedure, site/side was marked, verified correct patient position, special equipment/implants available, medications/allergies/relevent history reviewed, required imaging and test results available.  Time out performed.   Maximum sterile technique was used including gloves, gown, hand hygiene and mask.  MAC and 3  With Glidescope use, VC were visualized. ET size 8 was inserted w/o difficulty.  Meds received : Versed 4 mg, Fentanyl 200 mcg, Etomidate 40 mg, Rocuronium 50 mg > all IV and divided doses.    Evaluation Hemodynamic Status: BP stable throughout; O2 sats: stable throughout Patient's Current Condition: stable Complications: No apparent complications Patient did tolerate procedure well. Chest X-ray ordered to verify placement.  CXR: tube position acceptable.   [name] [name] 02/23/2016

## 2016-02-23 NOTE — Consult Note (Addendum)
Subjective:  Chief Complaint: Right shoulder pain,  CAP/Empyema  History of the present illness:The patient is a 40 year old male who presented to the emergency department at [facility] on yesterday's date with right shoulder pain with radiation into the chest. He noted this initially wall doing some yard work. On presentation to the emergency department he was noted to be tachypneic. He denies fevers or chills. He has had some cough and notes that he is less comfortable breathing when lying flat. He is also noted some swelling in his lower legs. The pain is worse with inspiration on the right side. He underwent evaluation to include chest x-ray which revealed a right-sided effusion with consolidation. He was started on vancomycin as well as Rocephin and Zithromax.He is a smoker of approximately 1 pack per day. He does have environmental allergies with significant upper airway/sinus congestion at times. He apparently snores as well as has episodes of apnea at night but has not had a formal sleep study in the past. He reports he does have some type of previous pneumoconiosis infection related to patient is last year but is uncertain whether this is a coccidiomycosis or other agent. He was admitted intensive care unit for further evaluation and treatment. A thoracentesis was also performed by interventional radiology which obtained 180 mL of yellow colored pleural fluid.. A pulmonology consultation was obtained and concern was raised for possibility of empyema. A chest CT scan was obtained which confirmed findings of the effusion as well as possibility of pneumonia. In discussion with CT surgery, Dr.Van Trigt, it was felt the patient should be transferred to [facility] for further management to include video assisted thoracoscopy for drainage and decortication.    Patient Active Problem List   Diagnosis Date Noted  . CAP (community acquired pneumonia) 02/22/2016  . ARF (acute respiratory  failure) (HCC) 02/22/2016  . SOB (shortness of breath) 02/22/2016  . Chest pain 02/22/2016  . Pleural effusion on right 02/22/2016  . Respiratory disease    History reviewed. No pertinent past medical history.  History reviewed. No pertinent past surgical history.  Prescriptions prior to admission  Medication Sig Dispense Refill Last Dose  . cyclobenzaprine (FLEXERIL) 10 MG tablet Take 1 tablet (10 mg total) by mouth 2 (two) times daily as needed for muscle spasms. 20 tablet 0 02/21/2016 at Unknown time  . ibuprofen (ADVIL,MOTRIN) 800 MG tablet Take 1 tablet (800 mg total) by mouth every 8 (eight) hours as needed for moderate pain. 21 tablet 0 02/21/2016 at Unknown time  . oxyCODONE-acetaminophen (PERCOCET/ROXICET) 5-325 MG tablet Take 2 tablets by mouth every 4 (four) hours as needed for severe pain. (Patient not taking: Reported on 02/21/2016) 6 tablet 0    Scheduled Meds: . azithromycin  500 mg Intravenous Q24H  . cefTRIAXone (ROCEPHIN)  IV  1 g Intravenous Q24H  . ipratropium-albuterol  3 mL Nebulization Q6H   Continuous Infusions:  PRN Meds:.acetaminophen, HYDROmorphone (DILAUDID) injection, ipratropium-albuterol   No Known Allergies  Social History  Substance Use Topics  . Smoking status: Current Every Day Smoker    Types: Cigarettes  . Smokeless tobacco: Not on file  . Alcohol Use: Yes     Comment: occ    History reviewed. No pertinent family history.  Review of Systems Pertinent items are noted in HPI.  Results for orders placed or performed during the hospital encounter of 02/21/16 (from the past 24 hour(s))  Blood gas, arterial     Status: Abnormal   Collection Time:  02/23/16 12:28 AM  Result Value Ref Range   O2 Content 4.0 L/min   Delivery systems NASAL CANNULA    pH, Arterial 7.380 7.350 - 7.450   pCO2 arterial 43.8 35.0 - 45.0 mmHg   pO2, Arterial 76.7 (L) 80.0 - 100.0 mmHg   Bicarbonate 24.8 (H) 20.0 - 24.0 mEq/L   Acid-Base Excess 0.8 0.0 - 2.0 mmol/L   O2  Saturation 94.8 %   Collection site LEFT RADIAL    Drawn by [id number]    Sample type ARTERIAL DRAW    Allens test (pass/fail) PASS PASS  CBC     Status: Abnormal   Collection Time: 02/23/16  9:17 AM  Result Value Ref Range   WBC 9.5 4.0 - 10.5 K/uL   RBC 4.05 (L) 4.22 - 5.81 MIL/uL   Hemoglobin 12.2 (L) 13.0 - 17.0 g/dL   HCT 29.5 (L) 62.1 - 30.8 %   MCV 89.4 78.0 - 100.0 fL   MCH 30.1 26.0 - 34.0 pg   MCHC 33.7 30.0 - 36.0 g/dL   RDW 65.7 84.6 - 96.2 %   Platelets 155 150 - 400 K/uL  Basic metabolic panel     Status: Abnormal   Collection Time: 02/23/16  9:17 AM  Result Value Ref Range   Sodium 136 135 - 145 mmol/L   Potassium 3.9 3.5 - 5.1 mmol/L   Chloride 103 101 - 111 mmol/L   CO2 26 22 - 32 mmol/L   Glucose, Bld 114 (H) 65 - 99 mg/dL   BUN 15 6 - 20 mg/dL   Creatinine, Ser 9.52 0.61 - 1.24 mg/dL   Calcium 8.1 (L) 8.9 - 10.3 mg/dL   GFR calc non Af Amer >60 >60 mL/min   GFR calc Af Amer >60 >60 mL/min   Anion gap 7 5 - 15  Blood gas, arterial     Status: Abnormal (Preliminary result)   Collection Time: 02/23/16  9:25 AM  Result Value Ref Range   O2 Content 87.4 L/min   Delivery systems NASAL CANNULA    Mode 4    pH, Arterial 7.376 7.350 - 7.450   pCO2 arterial 45.7 (H) 35.0 - 45.0 mmHg   pO2, Arterial 57.0 (L) 80.0 - 100.0 mmHg   Bicarbonate 25.1 (H) 20.0 - 24.0 mEq/L   TCO2 PENDING 0 - 100 mmol/L   Acid-Base Excess 1.5 0.0 - 2.0 mmol/L   O2 Saturation 87.4 %   Patient temperature 37.0    Collection site RIGHT RADIAL    Drawn by COLLECTED BY RT    Sample type ARTERIAL    Allens test (pass/fail) PASS PASS    Objective:   Patient Vitals for the past 8 hrs:  BP Temp Temp src Pulse Resp SpO2 Weight  02/23/16 0717 - - - - - 94 % -  02/23/16 0500 (!) 117/48 mmHg - - (!) 101 (!) 32 94 % 237 lb 3.4 oz (107.6 kg)  02/23/16 0400 124/74 mmHg 98.8 F (37.1 C) Axillary (!) 103 (!) 32 97 % -  02/23/16 0300 (!) 127/53 mmHg - - (!) 106 (!) 30 96 % -   BP 147/86 mmHg   Pulse 106  Temp(Src) 98.8 F (37.1 C) (Axillary)  Resp 36  Ht  (1.854 m)  Wt 237 lb 3.4 oz (107.6 kg)  BMI 31.30 kg/m2  SpO2 95% General appearance: alert, cooperative, distracted, fatigued and mild distress Head: Normocephalic, without obvious abnormality, atraumatic Eyes:PERRL,EOMI,  anict, conjunctiva clear Throat: dry tongue Neck:  no adenopathy, no carotid bruit, no JVD, supple, symmetrical, trachea midline and thyroid not enlarged, symmetric, no tenderness/mass/nodules Back: symmetric, no curvature. ROM normal. No CVA tenderness. Lungs: coarse in upper airways, dim in right lower fields Chest wall: no tenderness Heart: regular rate and rhythm, S1, S2 normal, no murmur, click, rub or gallop Abdomen: moderate distension, + BS, obese, non-tender, no obvious organomegally Extremities: trace edema Pulses: 2+ and symmetric Skin: Skin color, texture, turgor normal. No rashes or lesions Lymph nodes: Cervical, supraclavicular, and axillary nodes normal. Neurologic: Grossly normal  ECG: sinus tachy with RAD Echo:02/22/1016 Study Conclusions  - Left ventricle: The cavity size was normal. Wall thickness was  normal. Systolic function was normal. The estimated ejection  fraction was in the range of 55% to 60%. Wall motion was normal;  there were no regional wall motion abnormalities. Left  ventricular diastolic function parameters were normal. - Mitral valve: There was trivial regurgitation. - Right atrium: Central venous pressure (est): 3 mm Hg. - Tricuspid valve: There was trivial regurgitation. - Pulmonary arteries: Systolic pressure could not be accurately  estimated. - Pericardium, extracardiac: There was no pericardial effusion.  Impressions:  - Normal LV wall thickness with LVEF 55-60%. Normal diastolic  function. Trivial mitral and tricuspid regurgitation. Chest X-Ray:  Dg Chest 1 View  02/22/2016  CLINICAL DATA:  Post thoracentesis EXAM: CHEST 1 VIEW  COMPARISON:  Earlier today FINDINGS: Persistent large right pleural effusion obscuring the lower lungs. Streaky retrocardiac opacity, mild atelectasis based on CT. No cardiomegaly when accounting for mediastinal fat pad. No pneumothorax. IMPRESSION: No acute finding after thoracentesis. Large and loculated right pleural effusion suspicious for empyema. Electronically Signed   By: Marnee Spring M.D.   On: 02/22/2016 11:02   Dg Ribs Unilateral W/chest Right  02/22/2016  CLINICAL DATA:  Shortness of breath. Increasing right chest and rib pain. Diminished breath sounds on the right. EXAM: RIGHT RIBS AND CHEST - 3+ VIEW COMPARISON:  None. FINDINGS: Complete opacification of the lower 1/2-2/3 of right hemithorax. This likely represents a combination of pleural fluid atelectasis or consolidation. Questionable trace left pleural effusion. Heart is normal in size, partially obscured by right hemithorax opacity. No pulmonary edema. No pneumothorax. No fracture or acute bony abnormality of the right ribs. IMPRESSION: 1. Opacification of the lower 1/2 of right hemithorax, likely combination of pleural fluid and atelectasis or consolidation. No pneumothorax or acute rib fracture is seen. 2. Question small left pleural effusion. Electronically Signed   By: Rubye Oaks M.D.   On: 02/22/2016 00:33   Ct Chest Wo Contrast  02/22/2016  CLINICAL DATA:  Right shoulder pain radiating to the right upper back for 1 week. Patient injured the right shoulder while pulling on patellar. Cough today. Shortness of breath. Follow-up of abnormal chest x-ray. EXAM: CT CHEST WITHOUT CONTRAST TECHNIQUE: Multidetector CT imaging of the chest was performed following the standard protocol without IV contrast. COMPARISON:  Chest radiograph 02/22/2016 FINDINGS: Normal heart size. Mild pericardial thickening. Normal caliber thoracic aorta. No significant lymphadenopathy in the chest. Esophagus is mostly decompressed. Evaluation of lungs is  limited due to respiratory motion artifact. There is a moderate-sized right pleural effusion with consolidation in the right lung base. Fluid in the fissures. Changes are nonspecific but could indicate pneumonia. Emphysematous changes in the lungs. Airways appear patent. Included portions of the upper abdominal organs demonstrate no gross abnormality in the unenhanced imaging. Degenerative changes in the spine. No destructive bone lesions. Ribs appear intact. IMPRESSION: Moderate size right  pleural effusion with basilar consolidation. Appearance is nonspecific but could indicate pneumonia. Followup PA and lateral chest X-ray is recommended in 3-4 weeks following trial of antibiotic therapy to ensure resolution and exclude underlying malignancy. Electronically Signed   By: Burman Nieves M.D.   On: 02/22/2016 02:03   US Thoracentesis Asp Pleural Space W/img Guide  02/22/2016  INDICATION: Loculated RIGHT pleural effusion, lung consolidation EXAM: ULTRASOUND GUIDED DIAGNOSTIC RIGHT THORACENTESIS PROCEDURE: Procedure, benefits, and risks of procedure were discussed with patient. Written informed consent for procedure was obtained. Time out protocol followed. Pleural effusion localized by ultrasound at the posterior RIGHT hemithorax. Fluid appeared mildly complicated and partially loculated. Skin prepped and draped in usual sterile fashion. Skin and soft tissues anesthetized with 10 mL of 1% lidocaine. Due to persistent pain, additional anesthesia with 5 mL of 2% lidocaine was utilized locally. Five [place] Yueh catheter placed into the RIGHT pleural space. 180 mL of yellow RIGHT pleural fluid was aspirated by syringe pump. Procedure tolerated well by patient without immediate complication. FINDINGS: The fluid sample of 180 mL was sent for laboratory analysis. IMPRESSION: Successful ultrasound guided diagnostic RIGHT thoracentesis yielding 180 mL of pleural fluid. Electronically Signed   By: Ulyses Southward M.D.   On:  02/22/2016 11:39   Assessment:   Surgery represents the best option for treating this patient's empyema He clearly has evidence of sleep apnea and will need more formal evaluation and probable CPAP  Upper airway congestion is also contributing to tachypnea. May need ENT evaluation as may have anatomically correctable issue Unsure if previous infection from [place] could be playing a part in current issue- doubtful Discussed smoking cessation  Plan:   Right VATS, drainage /decortication CCM is also assisting with management  Patient examined, chest CT scan and echocardiogram personally reviewed. Agree with above assessment and plan by [facility] PA-C I discussed the procedure of right VATS for drainage of empyema and decortication of lung with patient and his wife. This will be scheduled for the a.m. Because of tachypnea and a drop in oxygenation the patient is being sedated and intubated prior to surgery this p.m. by CCM. The patient understands the expected postoperative recovery to include chest tube drainage, ventilator management, and hospitalization for least 7 days. He understands that surgery is the only way to effectively manage and treat his pulmonary and pleural space infection.

## 2016-02-23 NOTE — Anesthesia Preprocedure Evaluation (Addendum)
Anesthesia Evaluation  Patient identified by MRN, date of birth, ID band Patient unresponsive    Reviewed: Allergy & Precautions, H&P , NPO status , Patient's Chart, lab work & pertinent test results  Airway Mallampati: Intubated       Dental no notable dental hx. (+) Teeth Intact   Pulmonary pneumonia, unresolved, Current Smoker,  Intubated and sedated on vent ? ARDS   Pulmonary exam normal  + decreased breath sounds      Cardiovascular negative cardio ROS   Rhythm:Regular Rate:Normal     Neuro/Psych negative neurological ROS  negative psych ROS   GI/Hepatic negative GI ROS, Neg liver ROS,   Endo/Other  negative endocrine ROS  Renal/GU negative Renal ROS  negative genitourinary   Musculoskeletal   Abdominal   Peds  Hematology negative hematology ROS (+)   Anesthesia Other Findings   Reproductive/Obstetrics negative OB ROS                            Anesthesia Physical Anesthesia Plan  ASA: IV  Anesthesia Plan: General   Post-op Pain Management:    Induction: Intravenous  Airway Management Planned: Double Lumen EBT  Additional Equipment: Arterial line and CVP  Intra-op Plan:   Post-operative Plan: Post-operative intubation/ventilation  Informed Consent: I have reviewed the patients History and Physical, chart, labs and discussed the procedure including the risks, benefits and alternatives for the proposed anesthesia with the patient or authorized representative who has indicated his/her understanding and acceptance.   Dental advisory given  Plan Discussed with: CRNA  Anesthesia Plan Comments:        Anesthesia Quick Evaluation

## 2016-02-23 NOTE — Progress Notes (Signed)
RN notified RT of patient sleeping. RT placed patient back on CPAP. Tolerating well at this time.

## 2016-02-23 NOTE — H&P (Addendum)
STAFF NOTE: I, Dr Lavinia Sharps have personally reviewed patient's available data, including medical history, events of note, physical examination and test results as part of my evaluation. I have discussed with resident/NP and other care providers such as pharmacist, RN and RRT.  In addition,  I personally evaluated patient and elicited key findings of   S: 40 year old admitted 02/21/2016 to [facility] ER for pleuritic pain, SIRs, and Rt sided CAP. On 02/22/16 CT showed large loculated effsion and diagnostic thora with parapneumic exudate (only able to remove ). On 02/23/2016 - significant resp distress RR 40 and largte loculated Rt effusion on CXR and transferred to [facility]. CVTS eval - needs VATS  O: Well built young man RR 35 No paradoxical Not moving right side much due to effusion  PULMONARY  Recent Labs Lab 02/22/16 0235 02/23/16 0028 02/23/16 0925  PHART 7.419 7.380 7.376  PCO2ART 34.3* 43.8 45.7*  PO2ART 59.8* 76.7* 57.0*  HCO3 22.9 24.8* 25.1*  TCO2  --   --  PENDING  O2SAT 90.3 94.8 87.4    CBC  Recent Labs Lab 02/22/16 0046 02/22/16 0329 02/23/16 0917  HGB 13.2 12.6* 12.2*  HCT 37.7* 36.3* 36.2*  WBC 11.2* 11.1* 9.5  PLT 140* 143* 155    COAGULATION No results for input(s): INR in the last 168 hours.  CARDIAC   Recent Labs Lab 02/22/16 0046  TROPONINI <0.03   No results for input(s): PROBNP in the last 168 hours.   CHEMISTRY  Recent Labs Lab 02/22/16 0046 02/22/16 0335 02/23/16 0917  NA 135 136 136  K 3.9 3.7 3.9  CL 104 106 103  CO2 22 21* 26  GLUCOSE 113* 123* 114*  BUN CREATININE 1.05 1.01 0.81  CALCIUM 8.0* 8.2* 8.1*   Estimated Creatinine Clearance: 160.7 mL/min (by C-G formula based on Cr of 0.81).   LIVER  Recent Labs Lab 02/22/16 0046 02/22/16 0335  AST 19 17  ALT 41 38  ALKPHOS 57 56  BILITOT 2.2* 1.6*  PROT 6.8 6.4*  ALBUMIN 3.5 3.4*     INFECTIOUS  Recent Labs Lab 02/22/16 0231 02/22/16 0528   LATICACIDVEN 0.8 0.8  PROCALCITON 0.53  --      ENDOCRINE CBG (last 3)  No results for input(s): GLUCAP in the last 72 hours.       IMAGING x48h  - image(s) personally visualized  -   highlighted in bold Dg Chest 1 View  02/22/2016  CLINICAL DATA:  Post thoracentesis EXAM: CHEST 1 VIEW COMPARISON:  Earlier today FINDINGS: Persistent large right pleural effusion obscuring the lower lungs. Streaky retrocardiac opacity, mild atelectasis based on CT. No cardiomegaly when accounting for mediastinal fat pad. No pneumothorax. IMPRESSION: No acute finding after thoracentesis. Large and loculated right pleural effusion suspicious for empyema. Electronically Signed   By: Marnee Spring M.D.   On: 02/22/2016 11:02   Dg Ribs Unilateral W/chest Right  02/22/2016  CLINICAL DATA:  Shortness of breath. Increasing right chest and rib pain. Diminished breath sounds on the right. EXAM: RIGHT RIBS AND CHEST - 3+ VIEW COMPARISON:  None. FINDINGS: Complete opacification of the lower 1/2-2/3 of right hemithorax. This likely represents a combination of pleural fluid atelectasis or consolidation. Questionable trace left pleural effusion. Heart is normal in size, partially obscured by right hemithorax opacity. No pulmonary edema. No pneumothorax. No fracture or acute bony abnormality of the right ribs. IMPRESSION: 1. Opacification of the lower 1/2 of right hemithorax, likely combination  of pleural fluid and atelectasis or consolidation. No pneumothorax or acute rib fracture is seen. 2. Question small left pleural effusion. Electronically Signed   By: Rubye Oaks M.D.   On: 02/22/2016 00:33   Ct Chest Wo Contrast  02/22/2016  CLINICAL DATA:  Right shoulder pain radiating to the right upper back for 1 week. Patient injured the right shoulder while pulling on patellar. Cough today. Shortness of breath. Follow-up of abnormal chest x-ray. EXAM: CT CHEST WITHOUT CONTRAST TECHNIQUE: Multidetector CT imaging of the  chest was performed following the standard protocol without IV contrast. COMPARISON:  Chest radiograph 02/22/2016 FINDINGS: Normal heart size. Mild pericardial thickening. Normal caliber thoracic aorta. No significant lymphadenopathy in the chest. Esophagus is mostly decompressed. Evaluation of lungs is limited due to respiratory motion artifact. There is a moderate-sized right pleural effusion with consolidation in the right lung base. Fluid in the fissures. Changes are nonspecific but could indicate pneumonia. Emphysematous changes in the lungs. Airways appear patent. Included portions of the upper abdominal organs demonstrate no gross abnormality in the unenhanced imaging. Degenerative changes in the spine. No destructive bone lesions. Ribs appear intact. IMPRESSION: Moderate size right pleural effusion with basilar consolidation. Appearance is nonspecific but could indicate pneumonia. Followup PA and lateral chest X-ray is recommended in 3-4 weeks following trial of antibiotic therapy to ensure resolution and exclude underlying malignancy. Electronically Signed   By: Burman Nieves M.D.   On: 02/22/2016 02:03   US Thoracentesis Asp Pleural Space W/img Guide  02/22/2016  INDICATION: Loculated RIGHT pleural effusion, lung consolidation EXAM: ULTRASOUND GUIDED DIAGNOSTIC RIGHT THORACENTESIS PROCEDURE: Procedure, benefits, and risks of procedure were discussed with patient. Written informed consent for procedure was obtained. Time out protocol followed. Pleural effusion localized by ultrasound at the posterior RIGHT hemithorax. Fluid appeared mildly complicated and partially loculated. Skin prepped and draped in usual sterile fashion. Skin and soft tissues anesthetized with 10 mL of 1% lidocaine. Due to persistent pain, additional anesthesia with 5 mL of 2% lidocaine was utilized locally. Five [place] Yueh catheter placed into the RIGHT pleural space. 180 mL of yellow RIGHT pleural fluid was aspirated by syringe  pump. Procedure tolerated well by patient without immediate complication. FINDINGS: The fluid sample of 180 mL was sent for laboratory analysis. IMPRESSION: Successful ultrasound guided diagnostic RIGHT thoracentesis yielding 180 mL of pleural fluid. Electronically Signed   By: Ulyses Southward M.D.   On: 02/22/2016 11:39       A:  >  ? Baseline OSA Acute resp failure due to CAP with complication of right empyema  P: O2 Increase ceft dose + continue Azithro Check autoimmune Check PCT,  Urine legionella, urine strep Check HIV CVTS consult - CT suggests he will not respond to tube thoracostomy   - better off with VATS 02/23/2016 ASAP - d/w Dr Morton Peters - surgery slot only available 02/24/16   Intubate if worsens Post exutbation - might need CPAP    .  Rest per NP/medical resident whose note is outlined above and that I agree with  The patient is critically ill with multiple organ systems failure and requires high complexity decision making for assessment and support, frequent evaluation and titration of therapies, application of advanced monitoring technologies and extensive interpretation of multiple databases.   Critical Care Time devoted to patient care services described in this note is  30  Minutes. This time reflects time of care of this signee Dr Kalman Shan. This critical care time does not reflect procedure  time, or teaching time or supervisory time of PA/NP/Med student/Med Resident etc but could involve care discussion time    Dr. Kalman ShanMurali Omer Monter, M.D., [facility].C.P Pulmonary and Critical Care Medicine Staff Physician [place] [facility] [place] Pulmonary and Critical Care Pager: [phone], If no answer or between  15:00h - 7:00h: call [phone]  02/23/2016 11:41 AM

## 2016-02-23 NOTE — Progress Notes (Signed)
Set up auto titrating CPAP for patient to wear while napping due to obstructing when asleep.  Pt RR in 50's-60's and sats decreasing even when patient awake.  Placed on 100% NRB and RR still high so switched to NIV through [employer] initial settings 100%, 16/8

## 2016-02-23 NOTE — H&P (Signed)
PULMONARY / CRITICAL CARE MEDICINE   Name: [name] [name] MRN: [id number] DOB: 02-Sep-1976    ADMISSION DATE:  02/21/2016 CONSULTATION DATE: 02/23/2016  REFERRING MD:    CHIEF COMPLAINT:   HISTORY OF PRESENT ILLNESS:   40 year old male with undiagnosed OSA, initially presented to ED on 4/15 stating that he had right shoulder pain and had injured it 3 days prior by pulling a rope, was ruled out for any acute issues and was sent home with flexeril and ibuprofen. Returned on 4/17 to [facility] with increased pain to right shoulder stating that it had become harder to take a deep breath. Found to have CAP, possible underlying empyema, and right pleural effusion with consolidation. Started on Rocephin and Zithromax on 4/18, thoracentesis performed on 4/18 without complications while noting that the pleural effusion was loculated and appeared to be exudative. TCTs felt like a VATS was needed, transferred to [facility] for further evaluation. PCCM to admit to ICU to monitor respiratory status closely.   PAST MEDICAL HISTORY :  He  has no past medical history on file.  PAST SURGICAL HISTORY: He  has no past surgical history on file.  No Known Allergies  No current facility-administered medications on file prior to encounter.   Current Outpatient Prescriptions on File Prior to Encounter  Medication Sig  . cyclobenzaprine (FLEXERIL) 10 MG tablet Take 1 tablet (10 mg total) by mouth 2 (two) times daily as needed for muscle spasms.  Marland Kitchen ibuprofen (ADVIL,MOTRIN) 800 MG tablet Take 1 tablet (800 mg total) by mouth every 8 (eight) hours as needed for moderate pain.  Marland Kitchen oxyCODONE-acetaminophen (PERCOCET/ROXICET) 5-325 MG tablet Take 2 tablets by mouth every 4 (four) hours as needed for severe pain. (Patient not taking: Reported on 02/21/2016)    FAMILY HISTORY:  His has no family status information on file.   SOCIAL HISTORY: He  reports that he has been smoking Cigarettes.  He does not have any smokeless  tobacco history on file. He reports that he drinks alcohol. He reports that he does not use illicit drugs.  REVIEW OF SYSTEMS:     SUBJECTIVE:  Adult male resting in bed, alert and oriented, follows commands, SOB - use of accessory muscles on supplemental oxygen.  VITAL SIGNS: BP 117/48 mmHg  Pulse 101  Temp(Src) 98.8 F (37.1 C) (Axillary)  Resp 32  Ht  (1.854 m)  Wt 237 lb 3.4 oz (107.6 kg)  BMI 31.30 kg/m2  SpO2 94%  HEMODYNAMICS:    VENTILATOR SETTINGS:    INTAKE / OUTPUT: I/O last 3 completed shifts: In: 2575 [P.O.:1200; I.V.:975; IV Piggyback:400] Out: 2025 [Urine:2025]  PHYSICAL EXAMINATION: General: Adult male, in bed,  alert and cooperative Neuro: Alert and oriented, follows commands  HEENT: Normocephalic, atraumatic   Cardiovascular:  Tachycardic, regular rhythm, no MRG, trace edema to lower extremities  Lungs: use of accessory muscles, diminished breath sounds to right lower lobe, coarse in bilateral upper lobes    Abdomen: active bowel sounds, non-tender  Musculoskeletal: no acute deformities  Skin: warm, dry, intact   LABS:  BMET  Recent Labs Lab 02/22/16 0046 02/22/16 0335  NA 135 136  K 3.9 3.7  CL 104 106  CO2 22 21*  BUN 17 17  CREATININE 1.05 1.01  GLUCOSE 113* 123*    Electrolytes  Recent Labs Lab 02/22/16 0046 02/22/16 0335  CALCIUM 8.0* 8.2*    CBC  Recent Labs Lab 02/22/16 0046 02/22/16 0329  WBC 11.2* 11.1*  HGB  13.2 12.6*  HCT 37.7* 36.3*  PLT 140* 143*    Coag's No results for input(s): APTT, INR in the last 168 hours.  Sepsis Markers  Recent Labs Lab 02/22/16 0231 02/22/16 0528  LATICACIDVEN 0.8 0.8  PROCALCITON 0.53  --     ABG  Recent Labs Lab 02/22/16 0235 02/23/16 0028  PHART 7.419 7.380  PCO2ART 34.3* 43.8  PO2ART 59.8* 76.7*    Liver Enzymes  Recent Labs Lab 02/22/16 0046 02/22/16 0335  AST 19 17  ALT 41 38  ALKPHOS 57 56  BILITOT 2.2* 1.6*  ALBUMIN 3.5 3.4*     Cardiac Enzymes  Recent Labs Lab 02/22/16 0046  TROPONINI <0.03    Glucose No results for input(s): GLUCAP in the last 168 hours.  Imaging Dg Chest 1 View  02/22/2016  CLINICAL DATA:  Post thoracentesis EXAM: CHEST 1 VIEW COMPARISON:  Earlier today FINDINGS: Persistent large right pleural effusion obscuring the lower lungs. Streaky retrocardiac opacity, mild atelectasis based on CT. No cardiomegaly when accounting for mediastinal fat pad. No pneumothorax. IMPRESSION: No acute finding after thoracentesis. Large and loculated right pleural effusion suspicious for empyema. Electronically Signed   By: Marnee SpringJonathon  Watts M.D.   On: 02/22/2016 11:02   [place] Thoracentesis Asp Pleural Space W/img Guide  02/22/2016  INDICATION: Loculated RIGHT pleural effusion, lung consolidation EXAM: ULTRASOUND GUIDED DIAGNOSTIC RIGHT THORACENTESIS PROCEDURE: Procedure, benefits, and risks of procedure were discussed with patient. Written informed consent for procedure was obtained. Time out protocol followed. Pleural effusion localized by ultrasound at the posterior RIGHT hemithorax. Fluid appeared mildly complicated and partially loculated. Skin prepped and draped in usual sterile fashion. Skin and soft tissues anesthetized with 10 mL of 1% lidocaine. Due to persistent pain, additional anesthesia with 5 mL of 2% lidocaine was utilized locally. Five [place] Yueh catheter placed into the RIGHT pleural space. 180 mL of yellow RIGHT pleural fluid was aspirated by syringe pump. Procedure tolerated well by patient without immediate complication. FINDINGS: The fluid sample of 180 mL was sent for laboratory analysis. IMPRESSION: Successful ultrasound guided diagnostic RIGHT thoracentesis yielding 180 mL of pleural fluid. Electronically Signed   By: Ulyses SouthwardMark  Boles M.D.   On: 02/22/2016 11:39     STUDIES:  4/17 CXR: Opacification of lower 1/2 of right hemithorax, likely pleural fluid and atelectasis or consolidation, ? Small left  effusion   4/18 CT Chest: Moderate size pleural effusion with basilar consolidation, ?PNA 4/18 Thoracentesis  4/18 CXR: Large and loculated right pleural effusion suspicious for empyema  4/18 Echo: EF 55-60, no wall motion abnormalities, trivial tricuspid regurgitation    CULTURES: 4/18 Blood x 2  4/18 Sputum  4/18 Right Lung   ANTIBIOTICS: Zithromax 4/18 >> Rocephin 4/18>>  SIGNIFICANT EVENTS: 4/15: ED for right shoulder pain after pulling on rope 3 days prior > no acute findings d/c with flexeril and ibuprofen 4/17: ED for increased right shoulder pain, hard to take deep breath bc of pain  4/19: Worsening Respiratory Distress, RR 40s, Large Loculated Rt effusion- [facility]   LINES/TUBES:   DISCUSSION: 40 year old male admitted for ?CAP, right pleural effusion, and ?underlying empyema. Underwent thoracentesis on 4/19 without complications that noted pleural effusion was loculated and appread to be exudative. Given the increase oxygenation needs, CVTS was consulted and suggested a VATS would be needed. [facility] to [facility]. Will continue antibiotics, monitor closely in ICU for increasing respiratory distress, with work-up for VATS.  ASSESSMENT / PLAN:  PULMONARY A:  Acute Hypoxic/Hypercarbic Respiratory Failure CAP Loculated right pleural effusion  H/O OSA  P:   CVTS - VATS ASAP Supplemental oxygen as needed  Duonebs as needed  CPAP at HS Monitor for worsening respiratory status - may need to intubate   CARDIOVASCULAR A: Tachycardia P:  EKG  Cardiac Monitoring   RENAL A:   No acute issues  P:   Electrolyte Replacement as needed  Serial BMP   GASTROINTESTINAL A:   No acute issues  P:   NPO - Impending VATS   HEMATOLOGIC A:   No acute issues  P:  Check HIV   INFECTIOUS A:   CAP  Severe sepsis Leukocytosis - Improving  P:   Trend Fever and WBC Serial CBC  Continue Rocephin and Zithromax   Check urine legionella and strep  Procalcitonin   ENDOCRINE A:   No acute issues  P:   Q4 CBG while NPO   NEUROLOGIC A:   No acute issues  P:    RASS goal: 0    FAMILY  - Updates:   - Inter-disciplinary family meet or Palliative Care meeting due by: 03/01/2016   Simonne Martinet ACNP-BC [facility] Pulmonary/Critical Care Pager # [phone] OR # [phone] if no answer

## 2016-02-23 NOTE — Progress Notes (Signed)
PULMONARY / CRITICAL CARE MEDICINE   Name: [name] [name] MRN: [id number] DOB: Dec 21, 1975    ADMISSION DATE:  02/21/2016  INTERVAL HISTORY: Pt transferred for empyema/loculated effusion. Was on 4 L, RR in 30s on admission.  He rapidly declined the whole day. Ended up on bipap and NRBM.  By the time I saw him, his RR was in 50s and HR in 130s. He was gasping and was in resp distress.    VITAL SIGNS: BP 152/90 mmHg  Pulse 122  Temp(Src) 102.3 F (39.1 C) (Oral)  Resp 20  Ht 6\' 1"  (1.854 m)  Wt 237 lb 3.4 oz (107.6 kg)  BMI 31.30 kg/m2  SpO2 99%  HEMODYNAMICS:    VENTILATOR SETTINGS: Vent Mode:  [-] PRVC FiO2 (%):  [100 %] 100 % Set Rate:  [20 bmp] 20 bmp Vt Set:  [640 mL] 640 mL PEEP:  [10 cmH20] 10 cmH20 Plateau Pressure:  [25 cmH20] 25 cmH20  INTAKE / OUTPUT: I/O last 3 completed shifts: In: 1710 [P.O.:960; I.V.:750] Out: 1975 [Urine:1975]  PHYSICAL EXAMINATION: General:  Now sedated Neuro:  (-) lateralizing signs HEENT:  PERLLA Cardiovascular:  Good s1/s2; tachycardic Lungs:  Inc WOB > intubated. Dec BS on Right lung.  Abdomen:  (+) BS soft. Obese (+) abd breathing Musculoskeletal:  (-) edema Skin:  (-) rash  LABS:  BMET  Recent Labs Lab 02/22/16 0046 02/22/16 0335 02/23/16 0917  NA 135 136 136  K 3.9 3.7 3.9  CL 104 106 103  CO2 22 21* 26  BUN 17 17 15   CREATININE 1.05 1.01 0.81  GLUCOSE 113* 123* 114*    Electrolytes  Recent Labs Lab 02/22/16 0046 02/22/16 0335 02/23/16 0917  CALCIUM 8.0* 8.2* 8.1*    CBC  Recent Labs Lab 02/22/16 0046 02/22/16 0329 02/23/16 0917  WBC 11.2* 11.1* 9.5  HGB 13.2 12.6* 12.2*  HCT 37.7* 36.3* 36.2*  PLT 140* 143* 155    Coag's No results for input(s): APTT, INR in the last 168 hours.  Sepsis Markers  Recent Labs Lab 02/22/16 0231 02/22/16 0528 02/23/16 1153  LATICACIDVEN 0.8 0.8  --   PROCALCITON 0.53  --  0.54    ABG  Recent Labs Lab 02/23/16 0028 02/23/16 0925  02/23/16 1817  PHART 7.380 7.376 7.385  PCO2ART 43.8 45.7* 46.5*  PO2ART 76.7* 57.0* 105.0*    Liver Enzymes  Recent Labs Lab 02/22/16 0046 02/22/16 0335  AST 19 17  ALT 41 38  ALKPHOS 57 56  BILITOT 2.2* 1.6*  ALBUMIN 3.5 3.4*    Cardiac Enzymes  Recent Labs Lab 02/22/16 0046  TROPONINI <0.03    Glucose  Recent Labs Lab 02/23/16 1152 02/23/16 1541  GLUCAP 104* 98    ASSESSMENT / PLAN:  PULMONARY A: Acute Hypoxemic Hypercapneic resp Fx 2/2 Empyema/Complicated PNA, concern for ARDS, with rapid clinical decline the last 12-24 hrs.  Concern for MRSA PNA and G(-) PNA OSA  P:   Pt intubated. Plan fro VATS/decortication in am. Treat as ARDS for now. Plateau was 25 on 8 ml/kg. ABG in an hr > adjust accordingly.  Will switch abx to zosyn and vanc. Deescalate once with cultures. Will send trache culture. Start IVF D5 1/2 NS 50 mls/hr. May need steroids for severe PNA.  Propofol. Cont other meds.  Wife aware of intubation and updated.     I spent  30  minutes of Critical Care time with this patient today, separate from intubation/procedure done.  Pollie Meyer, MD Pulmonary and Critical Care Medicine [facility] Pager: [phone] After 3 pm or if no response, call [phone]  02/23/2016, 7:35 PM

## 2016-02-23 NOTE — Progress Notes (Signed)
RT placed patient on CPAP auto mode : max 20, min 6 with 4L O2 bleed in. Patient able to tolerate for a short period and ask to be taken off due to not being sleepy at this time. RT placed patient back on HFNC. RT informed patient when he became sleepy have RN contact RT and we would place him back on CPAP. RN aware.

## 2016-02-23 NOTE — Progress Notes (Signed)
RT called due to patient sats dropping. Patient originally on CPAP with 4L O2 bleed in. O2 increased to 7L with no improvements. RT increased O2 to 12 L with improvements of sats to 93-97%. Will monitor and titrate down as needed and tolerated.

## 2016-02-23 NOTE — Progress Notes (Signed)
Pharmacy Antibiotic Note  [name] [name] is a 40 y.o. male admitted on 02/21/2016 with pneumonia. Pharmacy has been consulted to broaden to vanc/zosyn dosing. Received vanc 2g x 1 on 4/18. Tmax/24h 102.3, wbc wnl.  Plan: Vanc 1g IV q8h Zosyn 30min inf; then 3.375g IV q8h Monitor clinical progress, c/s, renal function, abx plan/LOT VT@SS    Height: 6\' 1"  (185.4 cm) Weight: 237 lb 3.4 oz (107.6 kg) IBW/kg (Calculated) : 79.9  Temp (24hrs), Avg:99.5 F (37.5 C), Min:97.6 F (36.4 C), Max:102.3 F (39.1 C)   Recent Labs Lab 02/22/16 0046 02/22/16 0231 02/22/16 0329 02/22/16 0335 02/22/16 0528 02/23/16 0917  WBC 11.2*  --  11.1*  --   --  9.5  CREATININE 1.05  --   --  1.01  --  0.81  LATICACIDVEN  --  0.8  --   --  0.8  --     Estimated Creatinine Clearance: 160.7 mL/min (by C-G formula based on Cr of 0.81).    No Known Allergies  Antimicrobials this admission: 4/18 vanc x1; 4/19 >>  4/19 zosyn >> 4/18 CTX >> 4/18 azithro >>  Dose adjustments this admission: n/a  Microbiology results: 4/18 BCx: ngtd 4/18 Sputum:   4/18 R lung fluid: 4/18 MRSA PCR: neg  [name] [name], PharmD, [facility] Clinical Pharmacist Pager [phone] 02/23/2016 7:51 PM

## 2016-02-23 NOTE — Progress Notes (Signed)
Advanced patient's ETT 4cm and patient's tube is now 27 at the lip. Per MD verbal order. RT will continue to monitor.

## 2016-02-23 NOTE — Progress Notes (Addendum)
Subjective: He continues to be somewhat short of breath. Family at bedside says he's been somewhat confused and very sleepy which is unusual for him. He continues to complain of right sided pleuritic chest pain.  Objective: Vital signs in last 24 hours: Temp:  [97 F (36.1 C)-100.8 F (38.2 C)] 98.8 F (37.1 C) (04/19 0400) Pulse Rate:  [99-117] 101 (04/19 0500) Resp:  [22-41] 32 (04/19 0500) BP: (101-149)/(48-111) 117/48 mmHg (04/19 0500) SpO2:  [87 %-97 %] 94 % (04/19 0717) Weight:  [107.6 kg (237 lb 3.4 oz)] 107.6 kg (237 lb 3.4 oz) (04/19 0500) Weight change: -2.17 kg (-4 lb 12.6 oz) Last BM Date: 02/21/16  Intake/Output from previous day: 04/18 0701 - 04/19 0700 In: 1710 [P.O.:960; I.V.:750] Out: 825 [Urine:825]  PHYSICAL EXAM General appearance: alert, cooperative and Mildly confused and still somewhat "toxic" Resp: Diminished breath sounds on the right rhonchi bilaterally Cardio: regular rate and rhythm, S1, S2 normal, no murmur, click, rub or gallop GI: soft, non-tender; bowel sounds normal; no masses,  no organomegaly Extremities: Trace edema of his feet  Lab Results:  Results for orders placed or performed during the hospital encounter of 02/21/16 (from the past 48 hour(s))  Brain natriuretic peptide     Status: None   Collection Time: 02/22/16 12:42 AM  Result Value Ref Range   B Natriuretic Peptide 30.0 0.0 - 100.0 pg/mL  Comprehensive metabolic panel     Status: Abnormal   Collection Time: 02/22/16 12:46 AM  Result Value Ref Range   Sodium 135 135 - 145 mmol/L   Potassium 3.9 3.5 - 5.1 mmol/L   Chloride 104 101 - 111 mmol/L   CO2 22 22 - 32 mmol/L   Glucose, Bld 113 (H) 65 - 99 mg/dL   BUN 17 6 - 20 mg/dL   Creatinine, Ser 1.05 0.61 - 1.24 mg/dL   Calcium 8.0 (L) 8.9 - 10.3 mg/dL   Total Protein 6.8 6.5 - 8.1 g/dL   Albumin 3.5 3.5 - 5.0 g/dL   AST 19 15 - 41 U/L   ALT 41 17 - 63 U/L   Alkaline Phosphatase 57 38 - 126 U/L   Total Bilirubin 2.2 (H) 0.3  - 1.2 mg/dL   GFR calc non Af Amer >60 >60 mL/min   GFR calc Af Amer >60 >60 mL/min    Comment: (NOTE) The eGFR has been calculated using the CKD EPI equation. This calculation has not been validated in all clinical situations. eGFR's persistently <60 mL/min signify possible Chronic Kidney Disease.    Anion gap 9 5 - 15  CBC with Differential     Status: Abnormal   Collection Time: 02/22/16 12:46 AM  Result Value Ref Range   WBC 11.2 (H) 4.0 - 10.5 K/uL   RBC 4.29 4.22 - 5.81 MIL/uL   Hemoglobin 13.2 13.0 - 17.0 g/dL   HCT 37.7 (L) 39.0 - 52.0 %   MCV 87.9 78.0 - 100.0 fL   MCH 30.8 26.0 - 34.0 pg   MCHC 35.0 30.0 - 36.0 g/dL   RDW 14.4 11.5 - 15.5 %   Platelets 140 (L) 150 - 400 K/uL   Neutrophils Relative % 83 %   Neutro Abs 9.2 (H) 1.7 - 7.7 K/uL   Lymphocytes Relative 9 %   Lymphs Abs 1.1 0.7 - 4.0 K/uL   Monocytes Relative 8 %   Monocytes Absolute 0.9 0.1 - 1.0 K/uL   Eosinophils Relative 0 %   Eosinophils Absolute 0.1 0.0 -  0.7 K/uL   Basophils Relative 0 %   Basophils Absolute 0.0 0.0 - 0.1 K/uL  Troponin I     Status: None   Collection Time: 02/22/16 12:46 AM  Result Value Ref Range   Troponin I <0.03 <0.031 ng/mL    Comment:        NO INDICATION OF MYOCARDIAL INJURY.   Lactic acid, plasma     Status: None   Collection Time: 02/22/16  2:31 AM  Result Value Ref Range   Lactic Acid, Venous 0.8 0.5 - 2.0 mmol/L  HIV antibody     Status: None   Collection Time: 02/22/16  2:31 AM  Result Value Ref Range   HIV Screen 4th Generation wRfx Non Reactive Non Reactive    Comment: (NOTE) Performed At: [facility] [address] [place], [place] [id number] Lindon Romp MD KJ:[id number]   Procalcitonin - Baseline     Status: None   Collection Time: 02/22/16  2:31 AM  Result Value Ref Range   Procalcitonin 0.53 ng/mL    Comment:        Interpretation: PCT > 0.5 ng/mL and <= 2 ng/mL: Systemic infection (sepsis) is possible, but other conditions are  known to elevate PCT as well. (NOTE)         ICU PCT Algorithm               Non ICU PCT Algorithm    ----------------------------     ------------------------------         PCT < 0.25 ng/mL                 PCT < 0.1 ng/mL     Stopping of antibiotics            Stopping of antibiotics       strongly encouraged.               strongly encouraged.    ----------------------------     ------------------------------       PCT level decrease by               PCT < 0.25 ng/mL       >= 80% from peak PCT       OR PCT 0.25 - 0.5 ng/mL          Stopping of antibiotics                                             encouraged.     Stopping of antibiotics           encouraged.    ----------------------------     ------------------------------       PCT level decrease by              PCT >= 0.25 ng/mL       < 80% from peak PCT        AND PCT >= 0.5 ng/mL             Continuing antibiotics                                              encouraged.       Continuing antibiotics  encouraged.    ----------------------------     ------------------------------     PCT level increase compared          PCT > 0.5 ng/mL         with peak PCT AND          PCT >= 0.5 ng/mL             Escalation of antibiotics                                          strongly encouraged.      Escalation of antibiotics        strongly encouraged.   Blood gas, arterial     Status: Abnormal   Collection Time: 02/22/16  2:35 AM  Result Value Ref Range   FIO2 0.21    pH, Arterial 7.419 7.350 - 7.450   pCO2 arterial 34.3 (L) 35.0 - 45.0 mmHg   pO2, Arterial 59.8 (L) 80.0 - 100.0 mmHg   Bicarbonate 22.9 20.0 - 24.0 mEq/L   Acid-base deficit 2.0 0.0 - 2.0 mmol/L   O2 Saturation 90.3 %   Patient temperature 37.0    Allens test (pass/fail) POSITIVE (A) PASS  Culture, blood (Routine X 2) w Reflex to ID Panel     Status: None (Preliminary result)   Collection Time: 02/22/16  3:00 AM  Result Value Ref Range   Specimen  Description BLOOD RIGHT ANTECUBITAL    Special Requests      BOTTLES DRAWN AEROBIC AND ANAEROBIC AEB 10CC ANA [place]   Culture NO GROWTH < 12 HOURS    Report Status PENDING   MRSA PCR Screening     Status: None   Collection Time: 02/22/16  3:25 AM  Result Value Ref Range   MRSA by PCR NEGATIVE NEGATIVE    Comment:        The GeneXpert MRSA Assay (FDA approved for NASAL specimens only), is one component of a comprehensive MRSA colonization surveillance program. It is not intended to diagnose MRSA infection nor to guide or monitor treatment for MRSA infections.   CBC WITH DIFFERENTIAL     Status: Abnormal   Collection Time: 02/22/16  3:29 AM  Result Value Ref Range   WBC 11.1 (H) 4.0 - 10.5 K/uL   RBC 4.14 (L) 4.22 - 5.81 MIL/uL   Hemoglobin 12.6 (L) 13.0 - 17.0 g/dL   HCT 36.3 (L) 39.0 - 52.0 %   MCV 87.7 78.0 - 100.0 fL   MCH 30.4 26.0 - 34.0 pg   MCHC 34.7 30.0 - 36.0 g/dL   RDW 14.4 11.5 - 15.5 %   Platelets 143 (L) 150 - 400 K/uL   Neutrophils Relative % 83 %   Neutro Abs 9.3 (H) 1.7 - 7.7 K/uL   Lymphocytes Relative 9 %   Lymphs Abs 1.0 0.7 - 4.0 K/uL   Monocytes Relative 7 %   Monocytes Absolute 0.8 0.1 - 1.0 K/uL   Eosinophils Relative 1 %   Eosinophils Absolute 0.1 0.0 - 0.7 K/uL   Basophils Relative 0 %   Basophils Absolute 0.0 0.0 - 0.1 K/uL  Culture, blood (Routine X 2) w Reflex to ID Panel     Status: None (Preliminary result)   Collection Time: 02/22/16  3:35 AM  Result Value Ref Range   Specimen Description BLOOD LEFT HAND    Special Requests  BOTTLES DRAWN AEROBIC AND ANAEROBIC AEB 12CC ANA [place]   Culture NO GROWTH < 12 HOURS    Report Status PENDING   TSH     Status: None   Collection Time: 02/22/16  3:35 AM  Result Value Ref Range   TSH 1.875 0.350 - 4.500 uIU/mL  Comprehensive metabolic panel     Status: Abnormal   Collection Time: 02/22/16  3:35 AM  Result Value Ref Range   Sodium 136 135 - 145 mmol/L   Potassium 3.7 3.5 - 5.1 mmol/L    Chloride 106 101 - 111 mmol/L   CO2 21 (L) 22 - 32 mmol/L   Glucose, Bld 123 (H) 65 - 99 mg/dL   BUN 17 6 - 20 mg/dL   Creatinine, Ser 1.01 0.61 - 1.24 mg/dL   Calcium 8.2 (L) 8.9 - 10.3 mg/dL   Total Protein 6.4 (L) 6.5 - 8.1 g/dL   Albumin 3.4 (L) 3.5 - 5.0 g/dL   AST 17 15 - 41 U/L   ALT 38 17 - 63 U/L   Alkaline Phosphatase 56 38 - 126 U/L   Total Bilirubin 1.6 (H) 0.3 - 1.2 mg/dL   GFR calc non Af Amer >60 >60 mL/min   GFR calc Af Amer >60 >60 mL/min    Comment: (NOTE) The eGFR has been calculated using the CKD EPI equation. This calculation has not been validated in all clinical situations. eGFR's persistently <60 mL/min signify possible Chronic Kidney Disease.    Anion gap 9 5 - 15  Strep pneumoniae urinary antigen     Status: None   Collection Time: 02/22/16  4:00 AM  Result Value Ref Range   Strep Pneumo Urinary Antigen NEGATIVE NEGATIVE    Comment:        Infection due to S. pneumoniae cannot be absolutely ruled out since the antigen present may be below the detection limit of the test. Performed at [facility]   Urinalysis, Routine w reflex microscopic (not at [facility])     Status: None   Collection Time: 02/22/16  4:00 AM  Result Value Ref Range   Color, Urine YELLOW YELLOW   APPearance CLEAR CLEAR   Specific Gravity, Urine 1.010 1.005 - 1.030   pH 5.5 5.0 - 8.0   Glucose, UA NEGATIVE NEGATIVE mg/dL   Hgb urine dipstick NEGATIVE NEGATIVE   Bilirubin Urine NEGATIVE NEGATIVE   Ketones, ur NEGATIVE NEGATIVE mg/dL   Protein, ur NEGATIVE NEGATIVE mg/dL   Nitrite NEGATIVE NEGATIVE   Leukocytes, UA NEGATIVE NEGATIVE    Comment: MICROSCOPIC NOT DONE ON URINES WITH NEGATIVE PROTEIN, BLOOD, LEUKOCYTES, NITRITE, OR GLUCOSE <1000 mg/dL.  Influenza panel by PCR (type A & B, H1N1)     Status: None   Collection Time: 02/22/16  4:05 AM  Result Value Ref Range   Influenza A By PCR NEGATIVE NEGATIVE   Influenza B By PCR NEGATIVE NEGATIVE   H1N1 flu by pcr NOT  DETECTED NOT DETECTED    Comment:        The Xpert Flu assay (FDA approved for nasal aspirates or washes and nasopharyngeal swab specimens), is intended as an aid in the diagnosis of influenza and should not be used as a sole basis for treatment.   Lactic acid, plasma     Status: None   Collection Time: 02/22/16  5:28 AM  Result Value Ref Range   Lactic Acid, Venous 0.8 0.5 - 2.0 mmol/L  Body fluid cell count with differential  Status: Abnormal   Collection Time: 02/22/16  8:15 AM  Result Value Ref Range   Fluid Type-FCT PLEURAL     Comment: RIGHT CORRECTED ON 04/18 AT 1116: PREVIOUSLY REPORTED AS Pleural R    Color, Fluid YELLOW YELLOW   Appearance, Fluid HAZY' CLEAR   WBC, Fluid 3867 (H) 0 - 1000 cu mm   Neutrophil Count, Fluid 94 (H) 0 - 25 %   Lymphs, Fluid 3 %   Monocyte-Macrophage-Serous Fluid 3 (L) 50 - 90 %   Eos, Fluid 0 %   Other Cells, Fluid 0 %  Culture, body fluid-bottle     Status: None (Preliminary result)   Collection Time: 02/22/16  8:15 AM  Result Value Ref Range   Specimen Description LUNG RIGHT    Special Requests NONE 10CC EACH    Culture PENDING    Report Status PENDING   Gram stain     Status: None (Preliminary result)   Collection Time: 02/22/16  8:15 AM  Result Value Ref Range   Specimen Description LUNG RIGHT    Special Requests NONE    Gram Stain      NO ORGANISMS SEEN ABUNDANT WBC PRESENT, PREDOMINANTLY PMN    Report Status PENDING   Lactate dehydrogenase (CSF, pleural or peritoneal fluid)     Status: Abnormal   Collection Time: 02/22/16 10:05 AM  Result Value Ref Range   LD, Fluid 686 (H) 3 - 23 U/L    Comment: (NOTE) Results should be evaluated in conjunction with serum values    Fluid Type-FLDH Pleural R   Protein, pleural or peritoneal fluid     Status: None   Collection Time: 02/22/16 10:05 AM  Result Value Ref Range   Total protein, fluid 5.0 g/dL    Comment: (NOTE) No normal range established for this test Results should  be evaluated in conjunction with serum values    Fluid Type-FTP Pleural R   Glucose, pleural or peritoneal fluid     Status: None   Collection Time: 02/22/16 10:05 AM  Result Value Ref Range   Glucose, Fluid 67 mg/dL    Comment: (NOTE) No normal range established for this test Results should be evaluated in conjunction with serum values    Fluid Type-FGLU Pleural R   Blood gas, arterial     Status: Abnormal   Collection Time: 02/23/16 12:28 AM  Result Value Ref Range   O2 Content 4.0 L/min   Delivery systems NASAL CANNULA    pH, Arterial 7.380 7.350 - 7.450   pCO2 arterial 43.8 35.0 - 45.0 mmHg   pO2, Arterial 76.7 (L) 80.0 - 100.0 mmHg   Bicarbonate 24.8 (H) 20.0 - 24.0 mEq/L   Acid-Base Excess 0.8 0.0 - 2.0 mmol/L   O2 Saturation 94.8 %   Collection site LEFT RADIAL    Drawn by [id number]    Sample type ARTERIAL DRAW    Allens test (pass/fail) PASS PASS    ABGS  Recent Labs  02/23/16 0028  PHART 7.380  PO2ART 76.7*  HCO3 24.8*   CULTURES Recent Results (from the past 240 hour(s))  Culture, blood (Routine X 2) w Reflex to ID Panel     Status: None (Preliminary result)   Collection Time: 02/22/16  3:00 AM  Result Value Ref Range Status   Specimen Description BLOOD RIGHT ANTECUBITAL  Final   Special Requests   Final    BOTTLES DRAWN AEROBIC AND ANAEROBIC AEB 10CC ANA 6CC   Culture NO GROWTH < 12  HOURS  Final   Report Status PENDING  Incomplete  MRSA PCR Screening     Status: None   Collection Time: 02/22/16  3:25 AM  Result Value Ref Range Status   MRSA by PCR NEGATIVE NEGATIVE Final    Comment:        The GeneXpert MRSA Assay (FDA approved for NASAL specimens only), is one component of a comprehensive MRSA colonization surveillance program. It is not intended to diagnose MRSA infection nor to guide or monitor treatment for MRSA infections.   Culture, blood (Routine X 2) w Reflex to ID Panel     Status: None (Preliminary result)   Collection Time: 02/22/16   3:35 AM  Result Value Ref Range Status   Specimen Description BLOOD LEFT HAND  Final   Special Requests   Final    BOTTLES DRAWN AEROBIC AND ANAEROBIC AEB 12CC ANA [place]   Culture NO GROWTH < 12 HOURS  Final   Report Status PENDING  Incomplete  Culture, body fluid-bottle     Status: None (Preliminary result)   Collection Time: 02/22/16  8:15 AM  Result Value Ref Range Status   Specimen Description LUNG RIGHT  Final   Special Requests NONE 10CC EACH  Final   Culture PENDING  Incomplete   Report Status PENDING  Incomplete  Gram stain     Status: None (Preliminary result)   Collection Time: 02/22/16  8:15 AM  Result Value Ref Range Status   Specimen Description LUNG RIGHT  Final   Special Requests NONE  Final   Gram Stain   Final    NO ORGANISMS SEEN ABUNDANT WBC PRESENT, PREDOMINANTLY PMN    Report Status PENDING  Incomplete   Studies/Results: Dg Chest 1 View  02/22/2016  CLINICAL DATA:  Post thoracentesis EXAM: CHEST 1 VIEW COMPARISON:  Earlier today FINDINGS: Persistent large right pleural effusion obscuring the lower lungs. Streaky retrocardiac opacity, mild atelectasis based on CT. No cardiomegaly when accounting for mediastinal fat pad. No pneumothorax. IMPRESSION: No acute finding after thoracentesis. Large and loculated right pleural effusion suspicious for empyema. Electronically Signed   By: Monte Fantasia M.D.   On: 02/22/2016 11:02   Dg Ribs Unilateral W/chest Right  02/22/2016  CLINICAL DATA:  Shortness of breath. Increasing right chest and rib pain. Diminished breath sounds on the right. EXAM: RIGHT RIBS AND CHEST - 3+ VIEW COMPARISON:  None. FINDINGS: Complete opacification of the lower 1/2-2/3 of right hemithorax. This likely represents a combination of pleural fluid atelectasis or consolidation. Questionable trace left pleural effusion. Heart is normal in size, partially obscured by right hemithorax opacity. No pulmonary edema. No pneumothorax. No fracture or acute bony  abnormality of the right ribs. IMPRESSION: 1. Opacification of the lower 1/2 of right hemithorax, likely combination of pleural fluid and atelectasis or consolidation. No pneumothorax or acute rib fracture is seen. 2. Question small left pleural effusion. Electronically Signed   By: Jeb Levering M.D.   On: 02/22/2016 00:33   Ct Chest Wo Contrast  02/22/2016  CLINICAL DATA:  Right shoulder pain radiating to the right upper back for 1 week. Patient injured the right shoulder while pulling on patellar. Cough today. Shortness of breath. Follow-up of abnormal chest x-ray. EXAM: CT CHEST WITHOUT CONTRAST TECHNIQUE: Multidetector CT imaging of the chest was performed following the standard protocol without IV contrast. COMPARISON:  Chest radiograph 02/22/2016 FINDINGS: Normal heart size. Mild pericardial thickening. Normal caliber thoracic aorta. No significant lymphadenopathy in the chest. Esophagus is mostly  decompressed. Evaluation of lungs is limited due to respiratory motion artifact. There is a moderate-sized right pleural effusion with consolidation in the right lung base. Fluid in the fissures. Changes are nonspecific but could indicate pneumonia. Emphysematous changes in the lungs. Airways appear patent. Included portions of the upper abdominal organs demonstrate no gross abnormality in the unenhanced imaging. Degenerative changes in the spine. No destructive bone lesions. Ribs appear intact. IMPRESSION: Moderate size right pleural effusion with basilar consolidation. Appearance is nonspecific but could indicate pneumonia. Followup PA and lateral chest X-ray is recommended in 3-4 weeks following trial of antibiotic therapy to ensure resolution and exclude underlying malignancy. Electronically Signed   By: Lucienne Capers M.D.   On: 02/22/2016 02:03   US Thoracentesis Asp Pleural Space W/img Guide  02/22/2016  INDICATION: Loculated RIGHT pleural effusion, lung consolidation EXAM: ULTRASOUND GUIDED  DIAGNOSTIC RIGHT THORACENTESIS PROCEDURE: Procedure, benefits, and risks of procedure were discussed with patient. Written informed consent for procedure was obtained. Time out protocol followed. Pleural effusion localized by ultrasound at the posterior RIGHT hemithorax. Fluid appeared mildly complicated and partially loculated. Skin prepped and draped in usual sterile fashion. Skin and soft tissues anesthetized with 10 mL of 1% lidocaine. Due to persistent pain, additional anesthesia with 5 mL of 2% lidocaine was utilized locally. Five [place] Yueh catheter placed into the RIGHT pleural space. 180 mL of yellow RIGHT pleural fluid was aspirated by syringe pump. Procedure tolerated well by patient without immediate complication. FINDINGS: The fluid sample of 180 mL was sent for laboratory analysis. IMPRESSION: Successful ultrasound guided diagnostic RIGHT thoracentesis yielding 180 mL of pleural fluid. Electronically Signed   By: Lavonia Dana M.D.   On: 02/22/2016 11:39    Medications:  Prior to Admission:  Prescriptions prior to admission  Medication Sig Dispense Refill Last Dose  . cyclobenzaprine (FLEXERIL) 10 MG tablet Take 1 tablet (10 mg total) by mouth 2 (two) times daily as needed for muscle spasms. 20 tablet 0 02/21/2016 at Unknown time  . ibuprofen (ADVIL,MOTRIN) 800 MG tablet Take 1 tablet (800 mg total) by mouth every 8 (eight) hours as needed for moderate pain. 21 tablet 0 02/21/2016 at Unknown time  . oxyCODONE-acetaminophen (PERCOCET/ROXICET) 5-325 MG tablet Take 2 tablets by mouth every 4 (four) hours as needed for severe pain. (Patient not taking: Reported on 02/21/2016) 6 tablet 0    Scheduled: . azithromycin  500 mg Intravenous Q24H  . cefTRIAXone (ROCEPHIN)  IV  1 g Intravenous Q24H  . ipratropium-albuterol  3 mL Nebulization Q6H   Continuous:  TIR:[id number], HYDROmorphone (DILAUDID) injection, ipratropium-albuterol  Assesment: He was admitted with community-acquired pneumonia.  He has a right pleural effusion. He is still having some tachycardia and fever. C he is still complaining of right sided pleuritic chest pain. He has a neutrophil predominant pleural fluid which is clearly exudative. Principal Problem:   CAP (community acquired pneumonia) Active Problems:   ARF (acute respiratory failure) (HCC)   SOB (shortness of breath)   Chest pain   Pleural effusion on right    Plan: Continue current medications and treatments. Because of the pleural effusion and the possibility that he has empyema I'm going to discuss with thoracic surgery by telephone.    LOS: 1 day   [name] [name] L 02/23/2016, 7:31 AM I discussed with Dr. Darcey Nora and he feels this is likely empyema and needs VATS procedure. He will need to be transferred to [facility]. I think he is going to need an ICU bed so  the transfer will have to be made to critical care medicine and I am trying to work that out. I will discuss with hospitalist attending

## 2016-02-23 NOTE — Progress Notes (Signed)
[name] RoachPaul Hoffman NP and Dr Karma GreaserBoswell called to room due to increased work of breathing, inability to maintain sats, increased oxygen requirement, and RR >55. Patient diaphoretic. Wife at bedside and all questions answered. Increased patient to BiPAP per RT. Preparations made for possible intubation.

## 2016-02-24 ENCOUNTER — Inpatient Hospital Stay (HOSPITAL_COMMUNITY): Payer: MEDICAID | Admitting: Certified Registered"

## 2016-02-24 ENCOUNTER — Inpatient Hospital Stay (HOSPITAL_COMMUNITY): Payer: MEDICAID

## 2016-02-24 ENCOUNTER — Encounter (HOSPITAL_COMMUNITY)
Admission: EM | Disposition: A | Discharge status: Home / Self Care | Payer: Self-pay | Source: Home / Self Care | Attending: Internal Medicine

## 2016-02-24 ENCOUNTER — Inpatient Hospital Stay (HOSPITAL_COMMUNITY): Payer: Self-pay

## 2016-02-24 ENCOUNTER — Inpatient Hospital Stay (HOSPITAL_COMMUNITY): Payer: Self-pay | Admitting: Certified Registered"

## 2016-02-24 DIAGNOSIS — J9600 Acute respiratory failure, unspecified whether with hypoxia or hypercapnia: Secondary | ICD-10-CM

## 2016-02-24 DIAGNOSIS — Z789 Other specified health status: Secondary | ICD-10-CM

## 2016-02-24 DIAGNOSIS — Z978 Presence of other specified devices: Secondary | ICD-10-CM | POA: Diagnosis present

## 2016-02-24 HISTORY — PX: VIDEO ASSISTED THORACOSCOPY (VATS)/EMPYEMA: SHX6172

## 2016-02-24 LAB — GRAM STAIN

## 2016-02-24 LAB — CBC WITH DIFFERENTIAL/PLATELET
BASOS ABS: 0 10*3/uL (ref 0.0–0.1)
BASOS PCT: 0 %
Eosinophils Absolute: 0.1 10*3/uL (ref 0.0–0.7)
Eosinophils Relative: 1 %
HEMATOCRIT: 32.7 % — AB (ref 39.0–52.0)
Hemoglobin: 10.9 g/dL — ABNORMAL LOW (ref 13.0–17.0)
Lymphocytes Relative: 13 %
Lymphs Abs: 1.1 10*3/uL (ref 0.7–4.0)
MCH: 29.5 pg (ref 26.0–34.0)
MCHC: 33.3 g/dL (ref 30.0–36.0)
MCV: 88.4 fL (ref 78.0–100.0)
MONO ABS: 0.6 10*3/uL (ref 0.1–1.0)
Monocytes Relative: 7 %
NEUTROS ABS: 6.8 10*3/uL (ref 1.7–7.7)
NEUTROS PCT: 79 %
Platelets: 159 10*3/uL (ref 150–400)
RBC: 3.7 MIL/uL — ABNORMAL LOW (ref 4.22–5.81)
RDW: 14.5 % (ref 11.5–15.5)
WBC: 8.6 10*3/uL (ref 4.0–10.5)

## 2016-02-24 LAB — PROTIME-INR
INR: 1.33 (ref 0.00–1.49)
Prothrombin Time: 16.6 seconds — ABNORMAL HIGH (ref 11.6–15.2)

## 2016-02-24 LAB — ANTINUCLEAR ANTIBODIES, IFA: ANA Ab, IFA: NEGATIVE

## 2016-02-24 LAB — POCT I-STAT 3, ART BLOOD GAS (G3+)
ACID-BASE EXCESS: 1 mmol/L (ref 0.0–2.0)
ACID-BASE EXCESS: 4 mmol/L — AB (ref 0.0–2.0)
Acid-Base Excess: 2 mmol/L (ref 0.0–2.0)
BICARBONATE: 27.8 meq/L — AB (ref 20.0–24.0)
BICARBONATE: 27.7 meq/L — AB (ref 20.0–24.0)
Bicarbonate: 27.8 mEq/L — ABNORMAL HIGH (ref 20.0–24.0)
O2 SAT: 100 %
O2 Saturation: 97 %
O2 Saturation: 99 %
PCO2 ART: 53.4 mmHg — AB (ref 35.0–45.0)
PCO2 ART: 44.9 mmHg (ref 35.0–45.0)
PH ART: 7.325 — AB (ref 7.350–7.450)
PH ART: 7.435 (ref 7.350–7.450)
PO2 ART: 145 mmHg — AB (ref 80.0–100.0)
PO2 ART: 191 mmHg — AB (ref 80.0–100.0)
PO2 ART: 94 mmHg (ref 80.0–100.0)
Patient temperature: 99
Patient temperature: 98.3
TCO2: 29 mmol/L (ref 0–100)
TCO2: 29 mmol/L (ref 0–100)
TCO2: 29 mmol/L (ref 0–100)
pCO2 arterial: 41.8 mmHg (ref 35.0–45.0)
pH, Arterial: 7.398 (ref 7.350–7.450)

## 2016-02-24 LAB — PHOSPHORUS: PHOSPHORUS: 3.2 mg/dL (ref 2.5–4.6)

## 2016-02-24 LAB — MAGNESIUM: Magnesium: 2.1 mg/dL (ref 1.7–2.4)

## 2016-02-24 LAB — RHEUMATOID FACTOR: Rhuematoid fact SerPl-aCnc: 15.5 IU/mL — ABNORMAL HIGH (ref 0.0–13.9)

## 2016-02-24 LAB — PREPARE RBC (CROSSMATCH)

## 2016-02-24 LAB — BASIC METABOLIC PANEL
ANION GAP: 13 (ref 5–15)
BUN: 19 mg/dL (ref 6–20)
CALCIUM: 8.3 mg/dL — AB (ref 8.9–10.3)
CO2: 22 mmol/L (ref 22–32)
Chloride: 101 mmol/L (ref 101–111)
Creatinine, Ser: 1.2 mg/dL (ref 0.61–1.24)
Glucose, Bld: 137 mg/dL — ABNORMAL HIGH (ref 65–99)
Potassium: 3.9 mmol/L (ref 3.5–5.1)
Sodium: 136 mmol/L (ref 135–145)

## 2016-02-24 LAB — GLUCOSE, CAPILLARY
GLUCOSE-CAPILLARY: 109 mg/dL — AB (ref 65–99)
GLUCOSE-CAPILLARY: 153 mg/dL — AB (ref 65–99)
Glucose-Capillary: 114 mg/dL — ABNORMAL HIGH (ref 65–99)
Glucose-Capillary: 126 mg/dL — ABNORMAL HIGH (ref 65–99)
Glucose-Capillary: 132 mg/dL — ABNORMAL HIGH (ref 65–99)
Glucose-Capillary: 140 mg/dL — ABNORMAL HIGH (ref 65–99)

## 2016-02-24 LAB — ABO/RH: ABO/RH(D): A NEG

## 2016-02-24 LAB — PROCALCITONIN: PROCALCITONIN: 1.13 ng/mL

## 2016-02-24 LAB — ANTI-DNA ANTIBODY, DOUBLE-STRANDED

## 2016-02-24 LAB — MPO/PR-3 (ANCA) ANTIBODIES: ANCA Proteinase 3: <3.5 U/mL (ref 0.0–3.5)

## 2016-02-24 LAB — CYCLIC CITRUL PEPTIDE ANTIBODY, IGG/IGA: CCP Antibodies IgG/IgA: 4 units (ref 0–19)

## 2016-02-24 LAB — HIV ANTIBODY (ROUTINE TESTING W REFLEX): HIV Screen 4th Generation wRfx: NONREACTIVE

## 2016-02-24 LAB — SURGICAL PCR SCREEN
MRSA, PCR: NEGATIVE
Staphylococcus aureus: NEGATIVE

## 2016-02-24 LAB — APTT: aPTT: 32 seconds (ref 24–37)

## 2016-02-24 SURGERY — VIDEO ASSISTED THORACOSCOPY (VATS)/EMPYEMA
Anesthesia: General | Site: Chest | Laterality: Right

## 2016-02-24 MED ORDER — FENTANYL CITRATE (PF) 250 MCG/5ML IJ SOLN
INTRAMUSCULAR | Status: AC
  Filled 2016-02-24: qty 5

## 2016-02-24 MED ORDER — ACETAMINOPHEN 160 MG/5ML PO SOLN
1000.0000 mg | Freq: Four times a day (QID) | ORAL | Status: DC
  Administered 2016-02-24 – 2016-02-26 (×7): 1000 mg via ORAL
  Filled 2016-02-24 (×14): qty 40.6

## 2016-02-24 MED ORDER — ARTIFICIAL TEARS OP OINT
TOPICAL_OINTMENT | OPHTHALMIC | Status: AC
  Filled 2016-02-24: qty 3.5

## 2016-02-24 MED ORDER — VITAL HIGH PROTEIN PO LIQD: 1000.0000 mL | ORAL | Status: DC

## 2016-02-24 MED ORDER — PHENYLEPHRINE HCL 10 MG/ML IJ SOLN
INTRAMUSCULAR | Status: DC | PRN
  Administered 2016-02-24: 80 ug via INTRAVENOUS
  Administered 2016-02-24: 40 ug via INTRAVENOUS

## 2016-02-24 MED ORDER — VITAL HIGH PROTEIN PO LIQD
1000.0000 mL | ORAL | Status: DC
  Administered 2016-02-24: 25 mL/h

## 2016-02-24 MED ORDER — FENTANYL CITRATE (PF) 100 MCG/2ML IJ SOLN
INTRAMUSCULAR | Status: DC | PRN
  Administered 2016-02-24 (×5): 50 ug via INTRAVENOUS

## 2016-02-24 MED ORDER — PROPOFOL 500 MG/50ML IV EMUL
INTRAVENOUS | Status: DC | PRN
  Administered 2016-02-24: 40 ug/kg/min via INTRAVENOUS

## 2016-02-24 MED ORDER — MIDAZOLAM HCL 5 MG/5ML IJ SOLN
INTRAMUSCULAR | Status: DC | PRN
  Administered 2016-02-24: 2 mg via INTRAVENOUS

## 2016-02-24 MED ORDER — SODIUM CHLORIDE 0.9 % IV SOLN
INTRAVENOUS | Status: DC
  Administered 2016-02-24: 10 mL/h via INTRAVENOUS

## 2016-02-24 MED ORDER — POTASSIUM CHLORIDE 10 MEQ/50ML IV SOLN
10.0000 meq | Freq: Every day | INTRAVENOUS | Status: DC | PRN
  Administered 2016-02-27: 10 meq via INTRAVENOUS
  Filled 2016-02-24: qty 50

## 2016-02-24 MED ORDER — LUNG SURGERY BOOK
Freq: Once | Status: DC
  Filled 2016-02-24: qty 1

## 2016-02-24 MED ORDER — ROCURONIUM BROMIDE 100 MG/10ML IV SOLN
INTRAVENOUS | Status: DC | PRN
  Administered 2016-02-24: 60 mg via INTRAVENOUS
  Administered 2016-02-24: 20 mg via INTRAVENOUS
  Administered 2016-02-24: 40 mg via INTRAVENOUS
  Administered 2016-02-24: 30 mg via INTRAVENOUS

## 2016-02-24 MED ORDER — ROCURONIUM BROMIDE 50 MG/5ML IV SOLN
INTRAVENOUS | Status: AC
  Filled 2016-02-24: qty 2

## 2016-02-24 MED ORDER — ACETAMINOPHEN 500 MG PO TABS
1000.0000 mg | ORAL_TABLET | Freq: Four times a day (QID) | ORAL | Status: DC
  Filled 2016-02-24 (×8): qty 2

## 2016-02-24 MED ORDER — ARTIFICIAL TEARS OP OINT
TOPICAL_OINTMENT | OPHTHALMIC | Status: DC | PRN
  Administered 2016-02-24: 1 via OPHTHALMIC

## 2016-02-24 MED ORDER — HYDRALAZINE HCL 20 MG/ML IJ SOLN
10.0000 mg | Freq: Four times a day (QID) | INTRAMUSCULAR | Status: DC | PRN
  Administered 2016-02-24: 10 mg via INTRAVENOUS
  Filled 2016-02-24: qty 1

## 2016-02-24 MED ORDER — PROPOFOL 10 MG/ML IV BOLUS
INTRAVENOUS | Status: AC
  Filled 2016-02-24: qty 20

## 2016-02-24 MED ORDER — LACTATED RINGERS IV SOLN
INTRAVENOUS | Status: DC | PRN
  Administered 2016-02-24: 08:00:00 via INTRAVENOUS

## 2016-02-24 MED ORDER — LABETALOL HCL 5 MG/ML IV SOLN
10.0000 mg | Freq: Once | INTRAVENOUS | Status: AC
  Administered 2016-02-24: 10 mg via INTRAVENOUS
  Filled 2016-02-24: qty 4

## 2016-02-24 MED ORDER — MIDAZOLAM HCL 2 MG/2ML IJ SOLN
INTRAMUSCULAR | Status: AC
  Filled 2016-02-24: qty 2

## 2016-02-24 MED ORDER — VANCOMYCIN HCL IN DEXTROSE 1-5 GM/200ML-% IV SOLN
1000.0000 mg | Freq: Three times a day (TID) | INTRAVENOUS | Status: DC
  Administered 2016-02-24 – 2016-02-25 (×3): 1000 mg via INTRAVENOUS
  Filled 2016-02-24 (×4): qty 200

## 2016-02-24 MED ORDER — PHENYLEPHRINE 40 MCG/ML (10ML) SYRINGE FOR IV PUSH (FOR BLOOD PRESSURE SUPPORT)
PREFILLED_SYRINGE | INTRAVENOUS | Status: AC
  Filled 2016-02-24: qty 10

## 2016-02-24 MED ORDER — ONDANSETRON HCL 4 MG/2ML IJ SOLN: 4.0000 mg | Freq: Four times a day (QID) | INTRAMUSCULAR | Status: DC | PRN

## 2016-02-24 MED ORDER — 0.9 % SODIUM CHLORIDE (POUR BTL) OPTIME
TOPICAL | Status: DC | PRN
  Administered 2016-02-24: 1000 mL

## 2016-02-24 MED ORDER — HYDRALAZINE HCL 20 MG/ML IJ SOLN
10.0000 mg | INTRAMUSCULAR | Status: DC | PRN
  Administered 2016-02-25: 10 mg via INTRAVENOUS
  Filled 2016-02-24: qty 1

## 2016-02-24 MED ORDER — BISACODYL 5 MG PO TBEC
10.0000 mg | DELAYED_RELEASE_TABLET | Freq: Every day | ORAL | Status: DC
  Administered 2016-02-24 – 2016-02-27 (×4): 10 mg via ORAL
  Filled 2016-02-24 (×7): qty 2

## 2016-02-24 MED ORDER — PRO-STAT SUGAR FREE PO LIQD
30.0000 mL | Freq: Two times a day (BID) | ORAL | Status: AC
  Administered 2016-02-24 (×2): 30 mL
  Filled 2016-02-24 (×2): qty 30

## 2016-02-24 MED ORDER — HYDROMORPHONE HCL 1 MG/ML IJ SOLN: 0.2500 mg | INTRAMUSCULAR | Status: DC | PRN

## 2016-02-24 MED ORDER — SENNOSIDES-DOCUSATE SODIUM 8.6-50 MG PO TABS
1.0000 | ORAL_TABLET | Freq: Every day | ORAL | Status: DC
  Administered 2016-02-24 – 2016-02-27 (×4): 1 via ORAL
  Filled 2016-02-24 (×5): qty 1

## 2016-02-24 SURGICAL SUPPLY — 70 items
BAG DECANTER FOR FLEXI CONT (MISCELLANEOUS) IMPLANT
BLADE SURG 11 STRL SS (BLADE) IMPLANT
CANISTER SUCTION 2500CC (MISCELLANEOUS) ×3 IMPLANT
CATH KIT ON Q 5IN SLV (PAIN MANAGEMENT) IMPLANT
CATH ROBINSON RED A/P 22FR (CATHETERS) IMPLANT
CATH THORACIC 28FR (CATHETERS) IMPLANT
CATH THORACIC 36FR (CATHETERS) ×3 IMPLANT
CATH THORACIC 36FR RT ANG (CATHETERS) ×3 IMPLANT
CLEANER TIP ELECTROSURG 2X2 (MISCELLANEOUS) ×3 IMPLANT
CONN ST 1/4X3/8  BEN (MISCELLANEOUS) ×2
CONN ST 1/4X3/8 BEN (MISCELLANEOUS) ×1 IMPLANT
CONN Y 3/8X3/8X3/8  BEN (MISCELLANEOUS) ×2
CONN Y 3/8X3/8X3/8 BEN (MISCELLANEOUS) ×1 IMPLANT
CONT SPEC 4OZ CLIKSEAL STRL BL (MISCELLANEOUS) ×12 IMPLANT
DERMABOND ADVANCED (GAUZE/BANDAGES/DRESSINGS) ×2
DERMABOND ADVANCED .7 DNX12 (GAUZE/BANDAGES/DRESSINGS) ×1 IMPLANT
DRAIN CHANNEL 32F RND 10.7 FF (WOUND CARE) ×3 IMPLANT
DRAPE LAPAROSCOPIC ABDOMINAL (DRAPES) ×3 IMPLANT
DRAPE WARM FLUID 44X44 (DRAPE) ×3 IMPLANT
ELECT BLADE 4.0 EZ CLEAN MEGAD (MISCELLANEOUS) ×3
ELECT BLADE 6.5 EXT (BLADE) ×3 IMPLANT
ELECT REM PT RETURN 9FT ADLT (ELECTROSURGICAL) ×3
ELECTRODE BLDE 4.0 EZ CLN MEGD (MISCELLANEOUS) ×1 IMPLANT
ELECTRODE REM PT RTRN 9FT ADLT (ELECTROSURGICAL) ×1 IMPLANT
GAUZE SPONGE 4X4 12PLY STRL (GAUZE/BANDAGES/DRESSINGS) ×3 IMPLANT
GAUZE XEROFORM 1X8 LF (GAUZE/BANDAGES/DRESSINGS) ×3 IMPLANT
GLOVE BIO SURGEON STRL SZ 6.5 (GLOVE) ×4 IMPLANT
GLOVE BIO SURGEONS STRL SZ 6.5 (GLOVE) ×2
GLOVE ECLIPSE 6.0 STRL STRAW (GLOVE) ×6 IMPLANT
GOWN STRL REUS W/ TWL LRG LVL3 (GOWN DISPOSABLE) ×5 IMPLANT
GOWN STRL REUS W/TWL LRG LVL3 (GOWN DISPOSABLE) ×10
KIT BASIN OR (CUSTOM PROCEDURE TRAY) ×3 IMPLANT
KIT ROOM TURNOVER OR (KITS) ×3 IMPLANT
KIT SUCTION CATH 14FR (SUCTIONS) ×3 IMPLANT
NS IRRIG 1000ML POUR BTL (IV SOLUTION) ×12 IMPLANT
PACK CHEST (CUSTOM PROCEDURE TRAY) ×3 IMPLANT
PAD ARMBOARD 7.5X6 YLW CONV (MISCELLANEOUS) ×6 IMPLANT
SEALANT SURG COSEAL 4ML (VASCULAR PRODUCTS) IMPLANT
SOLUTION ANTI FOG 6CC (MISCELLANEOUS) ×3 IMPLANT
SPONGE GAUZE 4X4 12PLY STER LF (GAUZE/BANDAGES/DRESSINGS) ×3 IMPLANT
SPONGE TONSIL 1.25 RF SGL STRG (GAUZE/BANDAGES/DRESSINGS) ×9 IMPLANT
SUT CHROMIC 3 0 SH 27 (SUTURE) IMPLANT
SUT ETHILON 3 0 PS 1 (SUTURE) IMPLANT
SUT PROLENE 3 0 SH DA (SUTURE) IMPLANT
SUT PROLENE 4 0 RB 1 (SUTURE)
SUT PROLENE 4-0 RB1 .5 CRCL 36 (SUTURE) IMPLANT
SUT PROLENE 6 0 C 1 30 (SUTURE) IMPLANT
SUT SILK  1 MH (SUTURE) ×6
SUT SILK 1 MH (SUTURE) ×3 IMPLANT
SUT SILK 1 TIES 10X30 (SUTURE) IMPLANT
SUT SILK 2 0SH CR/8 30 (SUTURE) IMPLANT
SUT SILK 3 0SH CR/8 30 (SUTURE) IMPLANT
SUT VIC AB 1 CTX 18 (SUTURE) ×6 IMPLANT
SUT VIC AB 2 TP1 27 (SUTURE) IMPLANT
SUT VIC AB 2-0 CT2 18 VCP726D (SUTURE) IMPLANT
SUT VIC AB 2-0 CTX 36 (SUTURE) ×3 IMPLANT
SUT VIC AB 3-0 SH 18 (SUTURE) IMPLANT
SUT VIC AB 3-0 X1 27 (SUTURE) IMPLANT
SUT VICRYL 0 UR6 27IN ABS (SUTURE) IMPLANT
SUT VICRYL 2 TP 1 (SUTURE) ×3 IMPLANT
SWAB COLLECTION DEVICE MRSA (MISCELLANEOUS) IMPLANT
SYSTEM SAHARA CHEST DRAIN ATS (WOUND CARE) ×3 IMPLANT
TAPE CLOTH SURG 4X10 WHT LF (GAUZE/BANDAGES/DRESSINGS) ×3 IMPLANT
TIP APPLICATOR SPRAY EXTEND 16 (VASCULAR PRODUCTS) IMPLANT
TOWEL OR 17X24 6PK STRL BLUE (TOWEL DISPOSABLE) ×3 IMPLANT
TOWEL OR 17X26 10 PK STRL BLUE (TOWEL DISPOSABLE) ×6 IMPLANT
TRAP SPECIMEN MUCOUS 40CC (MISCELLANEOUS) ×6 IMPLANT
TRAY FOLEY CATH 16FRSI W/METER (SET/KITS/TRAYS/PACK) IMPLANT
TUBE ANAEROBIC SPECIMEN COL (MISCELLANEOUS) IMPLANT
WATER STERILE IRR 1000ML POUR (IV SOLUTION) ×6 IMPLANT

## 2016-02-24 NOTE — Transfer of Care (Signed)
Immediate Anesthesia Transfer of Care Note  Patient: Kelly SplinterJames Stanford Pfahler  Procedure(s) Performed: Procedure(s): VIDEO ASSISTED THORACOSCOPY Right (VATS) for [facility]/EMPYEMA, decortication of lung (Right)  Patient Location: SICU  Anesthesia Type:General  Level of Consciousness: sedated and Patient remains intubated per anesthesia plan  Airway & Oxygen Therapy: Patient remains intubated per anesthesia plan and Patient placed on Ventilator (see vital sign flow sheet for setting)  Post-op Assessment: Report given to RN and Post -op Vital signs reviewed and stable  Post vital signs: Reviewed and stable  Last Vitals:  Filed Vitals:   02/24/16 0600 02/24/16 0700  BP: 96/63 100/65  Pulse: 87 86  Temp:    Resp: 28 28    Complications: No apparent anesthesia complications

## 2016-02-24 NOTE — Progress Notes (Signed)
The patient was examined and preop studies reviewed. There has been no change from the prior exam and the patient is ready for surgery.  plan Right VATS, drainage empyema on J Papesh

## 2016-02-24 NOTE — Anesthesia Procedure Notes (Addendum)
Central Venous Catheter Insertion Performed by: anesthesiologist 02/24/2016 8:15 AM Patient location: Pre-op. Preanesthetic checklist: patient identified, IV checked, site marked, risks and benefits discussed, surgical consent, monitors and equipment checked, pre-op evaluation, timeout performed and anesthesia consent Position: Trendelenburg Lidocaine 1% used for infiltration Landmarks identified and Seldinger technique used Catheter size: 8 Fr Central line was placed.Double lumen Procedure performed using ultrasound guided technique. Attempts: 1 Following insertion, dressing applied, line sutured and Biopatch. Post procedure assessment: blood return through all ports. Patient tolerated the procedure well with no immediate complications.

## 2016-02-24 NOTE — H&P (Addendum)
PULMONARY / CRITICAL CARE MEDICINE   Name: [name] [name] MRN: [id number] DOB: 20-Oct-1976    ADMISSION DATE:  02/21/2016 CONSULTATION DATE: 02/23/2016  REFERRING MD:    CHIEF COMPLAINT:   HISTORY OF PRESENT ILLNESS:   40 year old male with undiagnosed OSA, initially presented to ED on 4/15 stating that he had right shoulder pain and had injured it 3 days prior by pulling a rope, was ruled out for any acute issues and was sent home with flexeril and ibuprofen. Returned on 4/17 to [facility] with increased pain to right shoulder stating that it had become harder to take a deep breath. Found to have CAP, possible underlying empyema, and right pleural effusion with consolidation. Started on Rocephin and Zithromax on 4/18, thoracentesis performed on 4/18 without complications while noting that the pleural effusion was loculated and appeared to be exudative. TCTs felt like a VATS was needed, transferred to [facility] for further evaluation. PCCM to admit to ICU to monitor respiratory status closely.   SUBJECTIVE:  Just back from VATS   VITAL SIGNS: BP 100/65 mmHg  Pulse 86  Temp(Src) 99.6 F (37.6 C) (Oral)  Resp 28  Ht  (1.905 m)  Wt 107.6 kg (237 lb 3.4 oz)  BMI 29.65 kg/m2  SpO2 100%  HEMODYNAMICS:    VENTILATOR SETTINGS: Vent Mode:  [-] PRVC FiO2 (%):  [50 %-100 %] 50 % Set Rate:  [20 bmp-28 bmp] 28 bmp Vt Set:  [510 mL-640 mL] 510 mL PEEP:  [10 cmH20] 10 cmH20 Plateau Pressure:  [25 cmH20-35 cmH20] 29 cmH20  INTAKE / OUTPUT: I/O last 3 completed shifts: In: 2000.6 [I.V.:660.6; NG/GT:290; IV Piggyback:1050] Out: 2295 [Urine:2195; Emesis/NG output:100]  PHYSICAL EXAMINATION: General: Adult male, sedated from OR Neuro: Alert and oriented, follows commands  HEENT: Normocephalic, atraumatic   Cardiovascular:  s1 s2 RRR mild int tachy Lungs: ronchi, chest tubes x 3 Abdomen: active bowel sounds, non-tender  Musculoskeletal: no acute deformities  Skin: warm, dry, intact    LABS:  BMET  Recent Labs Lab 02/22/16 0335 02/23/16 0917 02/24/16 0249  NA 136 136 136  K 3.7 3.9 3.9  CL 106 103 101  CO2 21* 26 22  BUN CREATININE 1.01 0.81 1.20  GLUCOSE 123* 114* 137*    Electrolytes  Recent Labs Lab 02/22/16 0335 02/23/16 0917 02/24/16 0249  CALCIUM 8.2* 8.1* 8.3*  MG  --   --  2.1  PHOS  --   --  3.2    CBC  Recent Labs Lab 02/22/16 0329 02/23/16 0917 02/24/16 0249  WBC 11.1* 9.5 8.6  HGB 12.6* 12.2* 10.9*  HCT 36.3* 36.2* 32.7*  PLT 143* 155 159    Coag's  Recent Labs Lab 02/24/16 0249  APTT 32  INR 1.33    Sepsis Markers  Recent Labs Lab 02/22/16 0231 02/22/16 0528 02/23/16 1153 02/24/16 0249  LATICACIDVEN 0.8 0.8  --   --   PROCALCITON 0.53  --  0.54 1.13    ABG  Recent Labs Lab 02/23/16 2138 02/24/16 0053 02/24/16 0347  PHART 7.384 7.435 7.325*  PCO2ART 46.6* 41.8 53.4*  PO2ART 128.0* 145.0* 191.0*    Liver Enzymes  Recent Labs Lab 02/22/16 0046 02/22/16 0335  AST 19 17  ALT 41 38  ALKPHOS 57 56  BILITOT 2.2* 1.6*  ALBUMIN 3.5 3.4*    Cardiac Enzymes  Recent Labs Lab 02/22/16 0046  TROPONINI <0.03    Glucose  Recent Labs Lab 02/23/16 1152  02/23/16 1541 02/24/16 0006 02/24/16 0328  GLUCAP 104* 98 109* 114*    Imaging Dg Chest Port 1 View  02/24/2016  CLINICAL DATA:  Endotracheal tube placement. Tube was advanced 4 cm. EXAM: PORTABLE CHEST 1 VIEW COMPARISON:  Yesterday at 1925 hours FINDINGS: Endotracheal tube is been advanced, now 4.5 cm from the carina. Enteric tube in place, tip below the diaphragm not included in the field of view. Right lung opacity consistent with large partially loculated pleural effusion is unchanged from prior exam. Cardiomediastinal contours are unchanged. Left basilar atelectasis is again seen, with mild improvement. IMPRESSION: 1. Endotracheal tube no 4.5 cm from the carina, in appropriate position. 2. Large partially loculated right pleural  effusion is unchanged. Electronically Signed   By: Rubye Oaks M.D.   On: 02/24/2016 00:33   Dg Chest Port 1 View  02/23/2016  CLINICAL DATA:  Endotracheal placement. EXAM: PORTABLE CHEST 1 VIEW COMPARISON:  02/22/2016 FINDINGS: Endotracheal tube has its tip 8 cm above the carina. This could be advanced. Nasogastric tube enters the abdomen. Large loculated pleural fluid collection on the right with compressive atelectasis of the right lung. Some aeration of the right upper lobe. Left lung shows mild infiltrate or atelectasis at the base. IMPRESSION: Endotracheal tube tip 8 cm above the carina.  Consider advancing. Large loculated pleural fluid collection on the right with compressive atelectasis of the right lung. Some aeration in the right upper lung. Mild atelectasis or infiltrate at the left base. Electronically Signed   By: Paulina Fusi M.D.   On: 02/23/2016 19:34     STUDIES:  4/17 CXR: Opacification of lower 1/2 of right hemithorax, likely pleural fluid and atelectasis or consolidation, ? Small left effusion   4/18 CT Chest: Moderate size pleural effusion with basilar consolidation, ?PNA 4/18 Thoracentesis  4/18 CXR: Large and loculated right pleural effusion suspicious for empyema  4/18 Echo: EF 55-60, no wall motion abnormalities, trivial tricuspid regurgitation    CULTURES: 4/18 Blood x 2  4/18 Sputum  4/18 Right Lung   ANTIBIOTICS: Zithromax 4/18 >>4/19 Rocephin 4/18>>4/19 vanc 4/19>>> Zosyn 4/19>>>  SIGNIFICANT EVENTS: 4/15: ED for right shoulder pain after pulling on rope 3 days prior > no acute findings d/c with flexeril and ibuprofen 4/17: ED for increased right shoulder pain, hard to take deep breath bc of pain  4/19: Worsening Respiratory Distress, RR 40s, Large Loculated Rt effusion- transferred to [facility] for possible VATS  4/20 VATS  LINES/TUBES: 4/19 ett>>>  DISCUSSION: 40 year old male admitted for ?CAP, right pleural effusion, and ?underlying empyema.  Underwent thoracentesis on 4/19 without complications that noted pleural effusion was loculated and appread to be exudative. Given the increase oxygenation needs, CVTS was consulted and suggested a VATS would be needed. [facility] to [facility]. Will continue antibiotics, monitor closely in ICU for increasing respiratory distress, with work-up for VATS.   ASSESSMENT / PLAN:  PULMONARY A:  Acute Hypoxic/Hypercarbic Respiratory Failure CAP Loculated right pleural effusion - empyema H/O OSA  P:   CVTS - VATS ASAP Supplemental oxygen as needed  Duonebs as needed  ABg reviewed, consider repeat and assess mV needs Goal to 60% if needed and peep to 8 Chest tube to 20 cm suction Obtain pcxr post op for risk infiltrate left and then would lower volumes  CARDIOVASCULAR A: Tachycardia P:  Cardiac Monitoring  Pain control  RENAL A:   No acute issues  P:   Electrolyte Replacement as needed  Serial BMP  Allow pos balance  GASTROINTESTINAL A:   No acute issues  P:   NPO - Impending VATS  Start TF, OGt needed  HEMATOLOGIC A:   No acute issues  P:  Check HIV   INFECTIOUS A:   CAP - empyema Severe sepsis Leukocytosis - Improving  Unimpressive pct P:   Maintain Vosyn, follow OR cultures Procalcitonin no role, source control done  ENDOCRINE A:   No acute issues  P:   Q4 CBG while NPO   NEUROLOGIC A:   Vent dyscrony P:   RASS goal: prop rass -2  Add fent for pain  FAMILY  - Updates: none  - Inter-disciplinary family meet or Palliative Care meeting due by: 03/01/2016   Ccm time 35 min  Mcarthur Rossettianiel J. Tyson AliasFeinstein, MD, FACP Pgr: [phone] [facility]

## 2016-02-24 NOTE — Anesthesia Postprocedure Evaluation (Signed)
Anesthesia Post Note  Patient: [name] [name]  Procedure(s) Performed: Procedure(s) (LRB): VIDEO ASSISTED THORACOSCOPY Right (VATS) for /EMPYEMA, decortication of lung (Right)  Patient location during evaluation: SICU Anesthesia Type: General Level of consciousness: sedated Pain management: pain level controlled Vital Signs Assessment: post-procedure vital signs reviewed and stable Respiratory status: patient remains intubated per anesthesia plan Cardiovascular status: stable Anesthetic complications: no    Last Vitals:  Filed Vitals:   02/24/16 1115 02/24/16 1137  BP: 120/76   Pulse:    Temp:  36.9 C  Resp: 28     Last Pain:  Filed Vitals:   02/24/16 1137  PainSc: Asleep                 Danie Hannig,W. EDMOND

## 2016-02-24 NOTE — Progress Notes (Signed)
Initial Nutrition Assessment  DOCUMENTATION CODES:   Not applicable  INTERVENTION:    Initiate TF via OGT with Vital High Protein at 25 ml/h and Prostat 30 ml BID on day 1; on day 2, d/c Prostat and increase to goal rate of 65 ml/h (1560 ml per day) to provide 1560 kcals, 137 gm protein, 1304 ml free water daily.  Total intake from TF and Propofol will be 2415 kcals per day (100% of estimated needs).  NUTRITION DIAGNOSIS:   Inadequate oral intake related to inability to eat as evidenced by NPO status.  GOAL:   Patient will meet greater than or equal to 90% of their needs  MONITOR:   Vent status, Labs, Weight trends, TF tolerance, I & O's  REASON FOR ASSESSMENT:   Consult Enteral/tube feeding initiation and management  ASSESSMENT:   40 year old male with undiagnosed OSA, initially presented to ED on 4/15 stating that he had right shoulder pain and had injured it 3 days prior by pulling a rope, was ruled out for any acute issues and was sent home with flexeril and ibuprofen. [facility] with increased pain to right shoulder stating that it had become harder to take a deep breath. Found to have CAP, possible underlying empyema, and right pleural effusion with consolidation.    Discussed patient in ICU rounds and with RN today. Received MD Consult for TF initiation and management. Unable to complete nutrition focused physical exam at this time.   Patient is currently intubated on ventilator support MV: 14.7 L/min Temp (24hrs), Avg:100.3 F (37.9 C), Min:98.4 F (36.9 C), Max:102.3 F (39.1 C)  Propofol: 32.4 ml/hr providing 855 kcals per day  Diet Order:  Diet NPO time specified  Skin:  Wound (see comment) (puncture wound to back)  Last BM:  4/18  Height:   Ht Readings from Last 1 Encounters:  02/23/16 6\' 3"  (1.905 m)    Weight:   Wt Readings from Last 1 Encounters:  02/23/16 237 lb 3.4 oz (107.6 kg)    Ideal Body Weight:  89.1 kg  BMI:   Body mass index is 29.65 kg/(m^2).  Estimated Nutritional Needs:   Kcal:  2450  Protein:  120-140 gm  Fluid:  2.4 L  EDUCATION NEEDS:   No education needs identified at this time  Joaquin CourtsKimberly Harris, RD, LDN, CNSC Pager [phone] After Hours Pager [phone]

## 2016-02-24 NOTE — Brief Op Note (Signed)
02/21/2016 - 02/24/2016  10:32 AM  PATIENT:  [name] [name]  40 y.o. male  PRE-OPERATIVE DIAGNOSIS:  RIGHT EMPYEMA  POST-OPERATIVE DIAGNOSIS:  RIGHT EMPYEMA  PROCEDURE:  Procedure(s):  VIDEO ASSISTED THORACOSCOPY/MINI THORACOTOMY -Drainage of Empyema -Decortication of the Lung  SURGEON:  Surgeon(s) and Role:    * Kerin PernaPeter Van Trigt, MD - Primary  PHYSICIAN ASSISTANT: Lowella DandyErin Venita Seng PA-C  ANESTHESIA:   general  EBL:  Total I/O In: -  Out: 500 [Urine:500]  BLOOD ADMINISTERED:none  DRAINS: 36 Angled, 36 Straight, 32 Blake   LOCAL MEDICATIONS USED:  NONE  SPECIMEN:  Source of Specimen:  Pleural Fluid, Pleural Peel  DISPOSITION OF SPECIMEN:  Cytology, Pathology  COUNTS:  YES  TOURNIQUET:  * No tourniquets in log *  DICTATION: .Dragon Dictation  PLAN OF CARE: Admit to inpatient   PATIENT DISPOSITION:  ICU - intubated and hemodynamically stable.   Delay start of Pharmacological VTE agent (>24hrs) due to surgical blood loss or risk of bleeding: yes

## 2016-02-24 NOTE — Op Note (Signed)
NAMAl Decant:  [name], [name]                 ACCOUNT NO.:  [id number]  MEDICAL RECORD NO.:  [id number]  LOCATION:  2M12C                        FACILITY:  [facility]  PHYSICIAN:  Kerin PernaPeter Van [name], M.D.  DATE OF BIRTH:  10-26-1976  DATE OF PROCEDURE:  02/24/2016 DATE OF DISCHARGE:                              OPERATIVE REPORT   OPERATION: 1. Right VATS (video-assisted thoracoscopy) for drainage of empyema. 2. Mini thoracotomy for decortication of right lung.  SURGEON:  Kerin PernaPeter Van [name], M.D.  ASSISTANT:  [name] DandyErin Barrett, PA-C.  PREOPERATIVE DIAGNOSIS:  Right lower lobe pneumonia, loculated right empyema with entrapment of right lung.  POSTOPERATIVE DIAGNOSIS:  Right lower lobe pneumonia, loculated right empyema with entrapment of right lung.  ANESTHESIA:  General.  INDICATIONS:  The patient is a 40 year old, Caucasian male, smoker with history of 2-3 weeks of fever, cough, shortness of breath, and increasing but severe right chest wall pain.  He presented to an outside hospital, where a CT scan was performed showing evidence of pneumonia with empyema and compression of the right lung.  He was started on broad- spectrum antibiotics and sent to this hospital for thoracic surgical evaluation and treatment of his empyema.  He was febrile with an elevated white count.  I examined his CT scan and chest x-ray and discussed the results with the patient and his wife.  I discussed the indications and expected benefits of right VATS for drainage of empyema and decortication of right lung.  I discussed the details of the surgery including the use of general anesthesia, the location of the surgical incision, and expected postoperative recovery which would need short- term chest tube drainage application.  I discussed the risks to him including risks of bleeding, blood transfusion requirement, recurrent infection, prolonged ventilator dependence, air leak, prolonged chest tube dependence, and death.  After  reviewing these issues, he demonstrated his understanding and agreed to proceed with surgery under what I felt was an informed consent.  OPERATIVE FINDINGS: 1. Severe loculated empyema with entrapment of the right lower lobe,     right middle lobe, and right upper lobes from a visceral pleural     peel.  OPERATIVE PROCEDURE:  The patient was brought from the ICU directly to the OR as the patient had been intubated the night before surgery.  He was placed supine on the operating table and general anesthesia was induced.  He was turned right side up and prepped and draped as a sterile field.  A proper time-out was performed.  A small incision was made in the fifth interspace.  The camera was inserted.  500 mL of xanthochromic fluid was drained.  The scope was inserted again, but visualization was poor because of the adhesions in the pleural space.  The incision was extended and the ribs were gently spread slowly and with care the loculated pockets of fluid were opened and all drained which totaled over 1.1 L of fluid.  This was sent for culture as well as cytology.  After mobilization of the right lung, the fibrinous exudate on the parietal pleura was removed.  The peel on the visceral pleura was carefully removed off each lobe to allow better  expansion.  The chest was irrigated with a liter of warm saline.  Placed chest tubes in the anterior, posterior, and inferior aspect of the right pleural space and brought them out through separate incisions.  The pericostal sutures were placed to reapproximate the ribs.  The lung was then re-expanded under direct vision and filled the right pleural cavity.  The retention pericostal sutures were tied.  The muscle layer was closed with interrupted #1 Vicryl.  The subcutaneous and skin layers were closed in running Vicryl.  Chest tubes were connected to a Pleur-evac drainage system, and the patient was turned back supine.  The patient was  not extubated, but the bronchial blocker down the right side was removed. The patient then was transported back to the ICU for postop care.     Kerin Perna, M.D.     PV/MEDQ  D:  02/24/2016  T:  02/24/2016  Job:  [id number]  cc:   [name] [name], M.D.

## 2016-02-24 NOTE — Progress Notes (Signed)
RT Note: Pt transported from OR to unit on vent with no complications. RT will continue to monitor.

## 2016-02-25 ENCOUNTER — Inpatient Hospital Stay (HOSPITAL_COMMUNITY): Payer: Self-pay

## 2016-02-25 ENCOUNTER — Encounter (HOSPITAL_COMMUNITY): Payer: Self-pay | Admitting: Cardiothoracic Surgery

## 2016-02-25 DIAGNOSIS — I209 Angina pectoris, unspecified: Secondary | ICD-10-CM

## 2016-02-25 DIAGNOSIS — R0902 Hypoxemia: Secondary | ICD-10-CM

## 2016-02-25 LAB — BLOOD GAS, ARTERIAL
ACID-BASE EXCESS: 3.5 mmol/L — AB (ref 0.0–2.0)
Bicarbonate: 27.5 mEq/L — ABNORMAL HIGH (ref 20.0–24.0)
Drawn by: 437071
FIO2: 0.4
MECHVT: 510 mL
O2 SAT: 98.3 %
PATIENT TEMPERATURE: 99.3
PCO2 ART: 42.4 mmHg (ref 35.0–45.0)
PEEP/CPAP: 8 cmH2O
PH ART: 7.431 (ref 7.350–7.450)
RATE: 22 resp/min
TCO2: 28.8 mmol/L (ref 0–100)
pO2, Arterial: 122 mmHg — ABNORMAL HIGH (ref 80.0–100.0)

## 2016-02-25 LAB — ACID FAST SMEAR (AFB, MYCOBACTERIA)
Acid Fast Smear: NEGATIVE
Acid Fast Smear: NEGATIVE

## 2016-02-25 LAB — LEGIONELLA PNEUMOPHILA SEROGP 1 UR AG: L. pneumophila Serogp 1 Ur Ag: NEGATIVE

## 2016-02-25 LAB — CBC WITH DIFFERENTIAL/PLATELET
Basophils Absolute: 0 10*3/uL (ref 0.0–0.1)
Basophils Relative: 0 %
Eosinophils Absolute: 0.1 10*3/uL (ref 0.0–0.7)
Eosinophils Relative: 2 %
HEMATOCRIT: 35.7 % — AB (ref 39.0–52.0)
HEMOGLOBIN: 11.6 g/dL — AB (ref 13.0–17.0)
LYMPHS ABS: 0.8 10*3/uL (ref 0.7–4.0)
Lymphocytes Relative: 11 %
MCH: 28.9 pg (ref 26.0–34.0)
MCHC: 32.5 g/dL (ref 30.0–36.0)
MCV: 89 fL (ref 78.0–100.0)
MONO ABS: 0.7 10*3/uL (ref 0.1–1.0)
MONOS PCT: 10 %
NEUTROS ABS: 5.6 10*3/uL (ref 1.7–7.7)
NEUTROS PCT: 77 %
Platelets: 195 10*3/uL (ref 150–400)
RBC: 4.01 MIL/uL — ABNORMAL LOW (ref 4.22–5.81)
RDW: 14.7 % (ref 11.5–15.5)
WBC: 7.3 10*3/uL (ref 4.0–10.5)

## 2016-02-25 LAB — BASIC METABOLIC PANEL
Anion gap: 12 (ref 5–15)
BUN: 17 mg/dL (ref 6–20)
CHLORIDE: 101 mmol/L (ref 101–111)
CO2: 25 mmol/L (ref 22–32)
CREATININE: 1.03 mg/dL (ref 0.61–1.24)
Calcium: 7.7 mg/dL — ABNORMAL LOW (ref 8.9–10.3)
GFR calc Af Amer: >60 mL/min (ref >60)
GFR calc non Af Amer: >60 mL/min (ref >60)
GLUCOSE: 137 mg/dL — AB (ref 65–99)
Potassium: 3.9 mmol/L (ref 3.5–5.1)
Sodium: 138 mmol/L (ref 135–145)

## 2016-02-25 LAB — PHOSPHORUS
Phosphorus: 3.6 mg/dL (ref 2.5–4.6)
Phosphorus: 3.8 mg/dL (ref 2.5–4.6)
Phosphorus: 4.2 mg/dL (ref 2.5–4.6)

## 2016-02-25 LAB — MAGNESIUM
Magnesium: 2.1 mg/dL (ref 1.7–2.4)
Magnesium: 2.2 mg/dL (ref 1.7–2.4)
Magnesium: 2.2 mg/dL (ref 1.7–2.4)

## 2016-02-25 LAB — GLUCOSE, CAPILLARY
GLUCOSE-CAPILLARY: 149 mg/dL — AB (ref 65–99)
Glucose-Capillary: 136 mg/dL — ABNORMAL HIGH (ref 65–99)
Glucose-Capillary: 124 mg/dL — ABNORMAL HIGH (ref 65–99)
Glucose-Capillary: 117 mg/dL — ABNORMAL HIGH (ref 65–99)
Glucose-Capillary: 130 mg/dL — ABNORMAL HIGH (ref 65–99)

## 2016-02-25 LAB — VANCOMYCIN, TROUGH: VANCOMYCIN TR: 7 ug/mL — AB (ref 10.0–20.0)

## 2016-02-25 MED ORDER — FENTANYL BOLUS VIA INFUSION
50.0000 ug | INTRAVENOUS | Status: DC | PRN
  Administered 2016-02-25 (×2): 25 ug via INTRAVENOUS
  Administered 2016-02-25 – 2016-02-26 (×3): 50 ug via INTRAVENOUS
  Filled 2016-02-25: qty 50

## 2016-02-25 MED ORDER — VANCOMYCIN HCL 10 G IV SOLR
1750.0000 mg | Freq: Three times a day (TID) | INTRAVENOUS | Status: DC
  Administered 2016-02-25 – 2016-02-27 (×6): 1750 mg via INTRAVENOUS
  Filled 2016-02-25 (×10): qty 1750

## 2016-02-25 MED ORDER — LABETALOL HCL 5 MG/ML IV SOLN: 10.0000 mg | INTRAVENOUS | Status: DC | PRN

## 2016-02-25 MED ORDER — SODIUM CHLORIDE 0.9 % IV SOLN
25.0000 ug/h | INTRAVENOUS | Status: DC
  Filled 2016-02-25: qty 50

## 2016-02-25 MED ORDER — FENTANYL CITRATE (PF) 100 MCG/2ML IJ SOLN
50.0000 ug | Freq: Once | INTRAMUSCULAR | Status: AC
  Administered 2016-02-25: 50 ug via INTRAVENOUS

## 2016-02-25 MED ORDER — SODIUM CHLORIDE 0.9 % IV SOLN
25.0000 ug/h | INTRAVENOUS | Status: DC
  Administered 2016-02-25: 50 ug/h via INTRAVENOUS
  Administered 2016-02-26: 200 ug/h via INTRAVENOUS
  Filled 2016-02-25 (×2): qty 50

## 2016-02-25 MED ORDER — HEPARIN SODIUM (PORCINE) 5000 UNIT/ML IJ SOLN
5000.0000 [IU] | Freq: Three times a day (TID) | INTRAMUSCULAR | Status: DC
  Administered 2016-02-25 – 2016-03-02 (×19): 5000 [IU] via SUBCUTANEOUS
  Filled 2016-02-25 (×19): qty 1

## 2016-02-25 MED ORDER — FENTANYL BOLUS VIA INFUSION
50.0000 ug | INTRAVENOUS | Status: DC | PRN
  Filled 2016-02-25: qty 50

## 2016-02-25 MED ORDER — FENTANYL CITRATE (PF) 100 MCG/2ML IJ SOLN
50.0000 ug | Freq: Once | INTRAMUSCULAR | Status: AC
  Filled 2016-02-25: qty 2

## 2016-02-25 NOTE — Progress Notes (Signed)
Called Md regarding possible aspiration as tube had slid out due to patient coughing. Tube readjusted to 27 at the lip and tube feeding stopped. CXR ordered and will follow up and continue to monitor. Suzy Bouchardhompson, Rodric Punch E, [place]RN 02/25/2016 [phone]

## 2016-02-25 NOTE — Progress Notes (Signed)
Pharmacy Antibiotic Note  [name] Snow is a 40 y.o. male admitted on 02/21/2016 with pneumonia. Pt on Vancomycin and Zosyn (Day #4).   Vancomycin trough 7 mcg/ml (subtherapeutic) on 1gmIV q8h (Goal trough 15-20 mcg/ml)  Plan: Change Vancomycin to 1750mg  IV q8h Continue Zosyn 3.375g IV q8h Monitor clinical progress, c/s, renal function, abx plan/LOT Vanc trough at Css on new dose   Height: 6\' 3"  (190.5 cm) Weight: 233 lb 4 oz (105.8 kg) IBW/kg (Calculated) : 84.5  Temp (24hrs), Avg:99.2 F (37.3 C), Min:98.4 F (36.9 C), Max:100.2 F (37.9 C)   Recent Labs Lab 02/22/16 0046 02/22/16 0231 02/22/16 0329 02/22/16 0335 02/22/16 0528 02/23/16 0917 02/24/16 0249 02/25/16 0453  WBC 11.2*  --  11.1*  --   --  9.5 8.6 7.3  CREATININE 1.05  --   --  1.01  --  0.81 1.20 1.03  LATICACIDVEN  --  0.8  --   --  0.8  --   --   --   VANCOTROUGH  --   --   --   --   --   --   --  7*    Estimated Creatinine Clearance: 129.2 mL/min (by C-G formula based on Cr of 1.03).    No Known Allergies  Antimicrobials this admission: 4/18 vanc x1; 4/19 >>  4/19 zosyn >> 4/18 CTX x1 4/18 azithro >>  Dose adjustments this admission: n/a  Microbiology results: 4/18 BCx: ngtd 4/18 Trach asp:   4/18 R lung fluid: ngtd 4/18 MRSA PCR: neg 4/20 R lung fluid: neg  Christoper Fabianaron Angelyna Henderson, PharmD, BCPS Clinical pharmacist, pager [phone] 02/25/2016 6:44 AM

## 2016-02-25 NOTE — Research (Cosign Needed)
Protocol Title: The MIND-USA Study Modifying the Impact of ICU-Associated Neurological Dysfunction: A multicenter, doubleblind, randomized, placebo-controlled trial investigating the effects of intravenously (IV) administered typical and atypical antipsychotics (haloperidol and ziprasidone) on delirium in critically ill patients.   MIND-USA Study Informed Consent   Subject Name: [name] Snow  This patient, [name] Snow, has been consented to the above clinical trial according to FDA regulations, GCP guidelines and [facility]'s SOPs. The informed consent form and study design have been explained to this patient's legally authorized representative, [name], by this RN. The surrogate demonstrated comprehension of the trial and study requirements/expectations. No study procedures have been initiated before consenting of this patient/surrogate. This surrogate was given sufficient time for reading the consent form. All risks, benefits and options have been thoroughly discussed and all questions were answered per the surrogate's satisfaction. This patient/surrogate was not coerced in any way to participate in this clinical trial. The surrogate has voluntarily signed consent version 5.2 at 09:30 AM on 02/25/2016. A copy of the signed consent form was given to the surrogate and a copy was placed in the subject's medical record.  While enrolled in the study and in the ICU, no open label antipsychotic medications can be administered to the patient.     Toula MoosFrances J Adamae Ricklefs, RN Clinical Research Nurse Coordinator [facility] Office: [phone]

## 2016-02-25 NOTE — Progress Notes (Signed)
Called to bedside due to tube slipping out of position. On arrival advanced patient's ETT to 27 at the lip. Bilateral breath sounds heard and patient is now getting inspiratory and expiratory volumes. Pending chest xray

## 2016-02-25 NOTE — Progress Notes (Signed)
1 Day Post-Op Procedure(s) (LRB): VIDEO ASSISTED THORACOSCOPY Right (VATS) for /EMPYEMA, decortication of lung (Right) Subjective: Stable postop right VATS for drainage of empyema and decortication of lung Chest x-ray this a.m. shows significant improvement in right lung aeration No air leak from chest tubes with decreasing drainage-we'll leave all 3 tubes in today Operative cultures of pleural peel and pleural fluid remain negative Continue Zosyn and vancomycin  Objective: Vital signs in last 24 hours: Temp:  [98.4 F (36.9 C)-100.2 F (37.9 C)] 99.2 F (37.3 C) (04/21 0753) Pulse Rate:  [54-120] 109 (04/21 0800) Cardiac Rhythm:  [-] Sinus tachycardia (04/21 0745) Resp:  [7-35] 31 (04/21 0800) BP: (114-179)/(65-91) 135/77 mmHg (04/21 0800) SpO2:  [97 %-100 %] 100 % (04/21 0800) Arterial Line BP: (126-190)/(57-86) 142/70 mmHg (04/21 0800) FiO2 (%):  [40 %-50 %] 40 % (04/21 0755) Weight:  [233 lb 4 oz (105.8 kg)] 233 lb 4 oz (105.8 kg) (04/21 0500)  Hemodynamic parameters for last 24 hours:   stable  Intake/Output from previous day: 04/20 0701 - 04/21 0700 In: 4157.8 [I.V.:2535.3; NG/GT:810; IV Piggyback:812.5] Out: 3235 [Urine:2870; Blood:75; Chest Tube:290] Intake/Output this shift: Total I/O In: -  Out: 250 [Urine:250]  Alert on vent Breath sounds clear overall, slight diminished at right base Surgical dressing dry intact  Lab Results:  Recent Labs  02/24/16 0249 02/25/16 0453  WBC 8.6 7.3  HGB 10.9* 11.6*  HCT 32.7* 35.7*  PLT 159 195   BMET:  Recent Labs  02/24/16 0249 02/25/16 0453  NA 136 138  K 3.9 3.9  CL 101 101  CO2 22 25  GLUCOSE 137* 137*  BUN 19 17  CREATININE 1.20 1.03  CALCIUM 8.3* 7.7*    PT/INR:  Recent Labs  02/24/16 0249  LABPROT 16.6*  INR 1.33   ABG    Component Value Date/Time   PHART 7.431 02/25/2016 0330   HCO3 27.5* 02/25/2016 0330   TCO2 28.8 02/25/2016 0330   ACIDBASEDEF 2.0 02/22/2016 0235   O2SAT 98.3  02/25/2016 0330   CBG (last 3)   Recent Labs  02/24/16 2332 02/25/16 0347 02/25/16 0751  GLUCAP 132* 149* 136*    Assessment/Plan: S/P Procedure(s) (LRB): VIDEO ASSISTED THORACOSCOPY Right (VATS) for /EMPYEMA, decortication of lung (Right) Then weaned per CCM Leave all tubes in today Follow-up chest x-ray in a.m. Continue current antibiotic coverage   LOS: 3 days    Kathlee Nationseter Van Trigt III 02/25/2016

## 2016-02-25 NOTE — Progress Notes (Signed)
PULMONARY / CRITICAL CARE MEDICINE   Name: [name] Snow MRN: [id number] DOB: 1976-04-12    ADMISSION DATE:  02/21/2016 CONSULTATION DATE: 02/23/2016  REFERRING MD:    CHIEF COMPLAINT:   HISTORY OF PRESENT ILLNESS:   40 year old male with undiagnosed OSA, initially presented to ED on 4/15 stating that he had right shoulder pain and had injured it 3 days prior by pulling a rope, was ruled out for any acute issues and was sent home with flexeril and ibuprofen. Returned on 4/17 to [facility] with increased pain to right shoulder stating that it had become harder to take a deep breath. Found to have CAP, possible underlying empyema, and right pleural effusion with consolidation. Started on Rocephin and Zithromax on 4/18, thoracentesis performed on 4/18 without complications while noting that the pleural effusion was loculated and appeared to be exudative. TCTs felt like a VATS was needed, transferred to [facility] for further evaluation. PCCM to admit to ICU to monitor respiratory status closely.   SUBJECTIVE:  Agitated overnight. Required several doses of fentanyl and dilaudid. Intubated, alert and following commands. Not tolerating SBT.   VITAL SIGNS: BP 135/77 mmHg  Pulse 109  Temp(Src) 99.2 F (37.3 C) (Oral)  Resp 31  Ht 6\' 3"  (1.905 m)  Wt 233 lb 4 oz (105.8 kg)  BMI 29.15 kg/m2  SpO2 100%  HEMODYNAMICS:    VENTILATOR SETTINGS: Vent Mode:  [-] PRVC FiO2 (%):  [40 %-50 %] 40 % Set Rate:  [22 bmp-28 bmp] 22 bmp Vt Set:  [510 mL] 510 mL PEEP:  [7 cmH20-10 cmH20] 7 cmH20 Plateau Pressure:  [22 cmH20-28 cmH20] 27 cmH20  INTAKE / OUTPUT: I/O last 3 completed shifts: In: 6158.3 [I.V.:3195.8; NG/GT:1100; IV Piggyback:1862.5] Out: 3980 [Urine:3515; Emesis/NG output:100; Blood:75; Chest Tube:290]  PHYSICAL EXAMINATION: General: Adult male Neuro: Alert and oriented, follows commands  HEENT: Normocephalic, atraumatic   Cardiovascular:  s1 s2 RRR mild int tachy Lungs: ronchi  improved, chest tubes x 3 Abdomen: active bowel sounds, non-tender  Musculoskeletal: no acute deformities  Skin: warm, dry, intact   LABS:  BMET  Recent Labs Lab 02/23/16 0917 02/24/16 0249 02/25/16 0453  NA 136 136 138  K 3.9 3.9 3.9  CL 103 101 101  CO2 26 22 25   BUN 15 19 17   CREATININE 0.81 1.20 1.03  GLUCOSE 114* 137* 137*    Electrolytes  Recent Labs Lab 02/23/16 0917 02/24/16 0249 02/25/16 0030 02/25/16 0453  CALCIUM 8.1* 8.3*  --  7.7*  MG  --  2.1 2.2 2.1  PHOS  --  3.2 3.6 3.8    CBC  Recent Labs Lab 02/23/16 0917 02/24/16 0249 02/25/16 0453  WBC 9.5 8.6 7.3  HGB 12.2* 10.9* 11.6*  HCT 36.2* 32.7* 35.7*  PLT 155 159 195    Coag's  Recent Labs Lab 02/24/16 0249  APTT 32  INR 1.33    Sepsis Markers  Recent Labs Lab 02/22/16 0231 02/22/16 0528 02/23/16 1153 02/24/16 0249  LATICACIDVEN 0.8 0.8  --   --   PROCALCITON 0.53  --  0.54 1.13    ABG  Recent Labs Lab 02/24/16 0347 02/24/16 1204 02/25/16 0330  PHART 7.325* 7.398 7.431  PCO2ART 53.4* 44.9 42.4  PO2ART 191.0* 94.0 122*    Liver Enzymes  Recent Labs Lab 02/22/16 0046 02/22/16 0335  AST 19 17  ALT 41 38  ALKPHOS 57 56  BILITOT 2.2* 1.6*  ALBUMIN 3.5 3.4*    Cardiac Enzymes  Recent  Labs Lab 02/22/16 0046  TROPONINI <0.03    Glucose  Recent Labs Lab 02/24/16 1137 02/24/16 1523 02/24/16 1945 02/24/16 2332 02/25/16 0347 02/25/16 0751  GLUCAP 126* 153* 140* 132* 149* 136*    Imaging Dg Chest Port 1 View  02/25/2016  CLINICAL DATA:  Follow-up empyema drainage on the right; community-acquired pneumonia, acute respiratory failure, intubated patient. EXAM: PORTABLE CHEST 1 VIEW COMPARISON:  Portable chest x-ray dated February 24, 2016 FINDINGS: The lungs are slightly less well inflated today. On the right somewhat increased density at the lung bases present accentuated by hypo inflation. The 3 chest tubes are in stable position. On the left there is no  alveolar infiltrate. Minimal vascular crowding is noted at the left lung base. The cardiac silhouette remains enlarged. The pulmonary vascularity is engorged centrally. The endotracheal tube tip lies 3.6 cm above the carina. The esophagogastric tube tip projects below the inferior margin of the image. The right internal jugular venous catheter tip projects over the proximal SVC. IMPRESSION: Mild bilateral hypo inflation accentuates the lung markings bilaterally. There remains a small amount of pleural fluid and/or pleural thickening on the right. No pneumothorax is evident. Stable moderate CHF. The support tubes are in reasonable position. Electronically Signed   By: David  [place] M.D.   On: 02/25/2016 07:31   Dg Chest Port 1 View  02/24/2016  CLINICAL DATA:  Empyema status post right VATS. Central line placement EXAM: PORTABLE CHEST 1 VIEW COMPARISON:  Chest radiograph from earlier today. FINDINGS: Endotracheal tube tip is 4 cm above the carina. Enteric tube enters stomach with the tip not seen on this image. Right internal jugular central venous catheter terminates in the upper third of the superior vena cava. Right apical and right basilar chest tubes are in position. Stable cardiomediastinal silhouette with normal heart size. No pneumothorax. Small residual right pleural effusion, significantly decreased. No left pleural effusion. Improved aeration in the right lung with persistent patchy opacity throughout the right lung, most prominent at the right lung base. No acute consolidative airspace disease in the left lung. Expected mild subcutaneous emphysema in the lateral lower right chest wall. IMPRESSION: 1. No pneumothorax. 2. Well-positioned support structures as described. 3. Small residual right pleural effusion, significantly decreased. 4. Improved aeration in the right lung with persistent patchy opacity throughout the right lung most prominent at the right lung base, suggestive of residual atelectasis  and/or pneumonia. Electronically Signed   By: Delbert Phenix M.D.   On: 02/24/2016 12:34     STUDIES:  4/17 CXR: Opacification of lower 1/2 of right hemithorax, likely pleural fluid and atelectasis or consolidation, ? Small left effusion   4/18 CT Chest: Moderate size pleural effusion with basilar consolidation, ?PNA 4/18 Thoracentesis  4/18 CXR: Large and loculated right pleural effusion suspicious for empyema  4/18 Echo: EF 55-60, no wall motion abnormalities, trivial tricuspid regurgitation    CULTURES: 4/18 Blood x 2 >> NG 4/18 Sputum >> No organisms 4/18 Right Lung  4/20 Pleural fluid >>>   ANTIBIOTICS: Zithromax 4/18 >>4/19 Rocephin 4/18>>4/19 vanc 4/19>>> Zosyn 4/19>>>  SIGNIFICANT EVENTS: 4/15: ED for right shoulder pain after pulling on rope 3 days prior > no acute findings d/c with flexeril and ibuprofen 4/17: ED for increased right shoulder pain, hard to take deep breath bc of pain  4/19: Worsening Respiratory Distress, RR 40s, Large Loculated Rt effusion- transferred to [facility] for possible VATS  4/20 VATS  LINES/TUBES: 4/19 ett>>> 4/20 rt IJ>>> 4/20 a line>>>  DISCUSSION:  40 year old male admitted for ?CAP, right pleural effusion, and ?underlying empyema. Underwent thoracentesis on 4/19 without complications that noted pleural effusion was loculated and appread to be exudative. Given the increase oxygenation needs, CVTS was consulted and suggested a VATS would be needed. [facility] to [facility]. Will continue antibiotics, monitor closely in ICU for increasing respiratory distress, with work-up for VATS.   ASSESSMENT / PLAN:  PULMONARY A:  Acute Hypoxic/Hypercarbic Respiratory Failure CAP Loculated right pleural effusion - empyema H/O OSA  P:   CVTS - VATS yesterday, 3 CT in place On PRVC, wean as tolerated - failed PS today Chest tube to 20 cm suction CXR with bilateral hypoinflation post-VATS control agitation abg reviewed, reduce  MV slight   CARDIOVASCULAR A: Tachycardia HTN P:  Cardiac Monitoring  Pain control man issue treat more effective labetolol prn SBP >150  RENAL A:   No acute issues  P:   Electrolyte Replacement as needed  Serial BMP  Allow pos balance to even goals  GASTROINTESTINAL A:   Concern aspiration P:   NPO Continue TF, OGT needed kub to assess ileus Start TF to 10 cc./hr, 20 max, may need reglan  HEMATOLOGIC A:   No acute issues  dvt prevention P:  HIV - negative Sub q hep add  INFECTIOUS A:   CAP - empyema Severe sepsis Leukocytosis - Improving  Unimpressive pct P:   Maintain Vanc/Zosyn, follow OR cultures Cultures pending  ENDOCRINE A:   No acute issues  P:   Q4 CBG while NPO  Start SSI if CBG > 200 x2  NEUROLOGIC A:   Vent dyscrony worsening pain P:   RASS goal: 0 to -1 Propofol gtt Fentanyl for pain to drip  FAMILY  - Updates: none  - Inter-disciplinary family meet or Palliative Care meeting due by: 03/01/2016  Valentino NoseNathan Boswell, MD IMTS PGY-1 [phone]  STAFF NOTE: I, Rory Percyaniel Feinstein, MD FACP have personally reviewed patient's available data, including medical history, events of note, physical examination and test results as part of my evaluation. I have discussed with resident/NP and other care providers such as pharmacist, RN and RRT. In addition, I personally evaluated patient and elicited key findings of: awake, less ronchi rt , better aeration rt, pcxr improved , CT per cvts, likely remain suction, abg reviewed, reduce rate, wean attempt noted, failed, from agitation / pain?, re treat more effective with fent drip then repeat wean attempt ps 5, cpap 5, pcxr in am , change to labetolol prn , dc hydralazine  With tachy mild, kvo, avoid pos balance today, add sub q hep, no bloody output noted, would be a good [place], will d/w research team and family, prop maintain, avoid benzo for now, keep same empiri cocverage, if no mrsa in am , dc  vanc The patient is critically ill with multiple organ systems failure and requires high complexity decision making for assessment and support, frequent evaluation and titration of therapies, application of advanced monitoring technologies and extensive interpretation of multiple databases.   Critical Care Time devoted to patient care services described in this note is 35 Minutes. This time reflects time of care of this signee: Rory Percyaniel Feinstein, MD FACP. This critical care time does not reflect procedure time, or teaching time or supervisory time of PA/NP/Med student/Med Resident etc but could involve care discussion time. Rest per NP/medical resident whose note is outlined above and that I agree with   Mcarthur Rossettianiel J. Tyson AliasFeinstein, MD,  FACP Pgr: [id number] [place] [facility] 02/25/2016 8:48 AM

## 2016-02-26 ENCOUNTER — Inpatient Hospital Stay (HOSPITAL_COMMUNITY): Payer: Self-pay

## 2016-02-26 DIAGNOSIS — J9690 Respiratory failure, unspecified, unspecified whether with hypoxia or hypercapnia: Secondary | ICD-10-CM | POA: Diagnosis present

## 2016-02-26 LAB — CBC WITH DIFFERENTIAL/PLATELET
Basophils Absolute: 0 10*3/uL (ref 0.0–0.1)
Basophils Relative: 0 %
EOS ABS: 0.1 10*3/uL (ref 0.0–0.7)
EOS PCT: 2 %
HCT: 32.7 % — ABNORMAL LOW (ref 39.0–52.0)
HEMOGLOBIN: 10.2 g/dL — AB (ref 13.0–17.0)
LYMPHS ABS: 1 10*3/uL (ref 0.7–4.0)
LYMPHS PCT: 17 %
MCH: 28.6 pg (ref 26.0–34.0)
MCHC: 31.2 g/dL (ref 30.0–36.0)
MCV: 91.6 fL (ref 78.0–100.0)
MONOS PCT: 8 %
Monocytes Absolute: 0.5 10*3/uL (ref 0.1–1.0)
Neutro Abs: 4.2 10*3/uL (ref 1.7–7.7)
Neutrophils Relative %: 73 %
PLATELETS: 182 10*3/uL (ref 150–400)
RBC: 3.57 MIL/uL — ABNORMAL LOW (ref 4.22–5.81)
RDW: 15 % (ref 11.5–15.5)
WBC: 5.8 10*3/uL (ref 4.0–10.5)

## 2016-02-26 LAB — COMPREHENSIVE METABOLIC PANEL
ALBUMIN: 1.8 g/dL — AB (ref 3.5–5.0)
ALK PHOS: 44 U/L (ref 38–126)
ALT: 42 U/L (ref 17–63)
AST: 54 U/L — AB (ref 15–41)
Anion gap: 9 (ref 5–15)
BILIRUBIN TOTAL: 1.8 mg/dL — AB (ref 0.3–1.2)
BUN: 17 mg/dL (ref 6–20)
CALCIUM: 8 mg/dL — AB (ref 8.9–10.3)
CO2: 29 mmol/L (ref 22–32)
Chloride: 102 mmol/L (ref 101–111)
Creatinine, Ser: 0.9 mg/dL (ref 0.61–1.24)
GFR calc Af Amer: >60 mL/min (ref >60)
GLUCOSE: 112 mg/dL — AB (ref 65–99)
Potassium: 4 mmol/L (ref 3.5–5.1)
Sodium: 140 mmol/L (ref 135–145)
TOTAL PROTEIN: 4.7 g/dL — AB (ref 6.5–8.1)

## 2016-02-26 LAB — GLUCOSE, CAPILLARY
GLUCOSE-CAPILLARY: 105 mg/dL — AB (ref 65–99)
GLUCOSE-CAPILLARY: 136 mg/dL — AB (ref 65–99)
GLUCOSE-CAPILLARY: 151 mg/dL — AB (ref 65–99)
GLUCOSE-CAPILLARY: 99 mg/dL (ref 65–99)
Glucose-Capillary: 108 mg/dL — ABNORMAL HIGH (ref 65–99)
Glucose-Capillary: 112 mg/dL — ABNORMAL HIGH (ref 65–99)

## 2016-02-26 LAB — PHOSPHORUS
PHOSPHORUS: 3.5 mg/dL (ref 2.5–4.6)
Phosphorus: 3.6 mg/dL (ref 2.5–4.6)

## 2016-02-26 LAB — MAGNESIUM
MAGNESIUM: 2.2 mg/dL (ref 1.7–2.4)
Magnesium: 2 mg/dL (ref 1.7–2.4)

## 2016-02-26 MED ORDER — DIPHENHYDRAMINE HCL 12.5 MG/5ML PO ELIX
12.5000 mg | ORAL_SOLUTION | Freq: Four times a day (QID) | ORAL | Status: DC | PRN
  Filled 2016-02-26: qty 5

## 2016-02-26 MED ORDER — FUROSEMIDE 10 MG/ML IJ SOLN
40.0000 mg | Freq: Once | INTRAMUSCULAR | Status: AC
  Administered 2016-02-26: 40 mg via INTRAVENOUS
  Filled 2016-02-26: qty 4

## 2016-02-26 MED ORDER — FENTANYL CITRATE (PF) 100 MCG/2ML IJ SOLN: 25.0000 ug | INTRAMUSCULAR | Status: DC | PRN

## 2016-02-26 MED ORDER — NALOXONE HCL 0.4 MG/ML IJ SOLN: 0.4000 mg | INTRAMUSCULAR | Status: DC | PRN

## 2016-02-26 MED ORDER — DIPHENHYDRAMINE HCL 50 MG/ML IJ SOLN: 12.5000 mg | Freq: Four times a day (QID) | INTRAMUSCULAR | Status: DC | PRN

## 2016-02-26 MED ORDER — KETOROLAC TROMETHAMINE 15 MG/ML IJ SOLN
15.0000 mg | Freq: Four times a day (QID) | INTRAMUSCULAR | Status: DC | PRN
  Administered 2016-02-26 – 2016-02-27 (×3): 15 mg via INTRAVENOUS
  Filled 2016-02-26 (×4): qty 1

## 2016-02-26 MED ORDER — ONDANSETRON HCL 4 MG/2ML IJ SOLN: 4.0000 mg | Freq: Four times a day (QID) | INTRAMUSCULAR | Status: DC | PRN

## 2016-02-26 MED ORDER — SODIUM CHLORIDE 0.9% FLUSH: 9.0000 mL | INTRAVENOUS | Status: DC | PRN

## 2016-02-26 MED ORDER — FENTANYL 40 MCG/ML IV SOLN
INTRAVENOUS | Status: DC
  Administered 2016-02-26: 140 ug via INTRAVENOUS
  Administered 2016-02-27: 20 ug via INTRAVENOUS
  Administered 2016-02-27: 80 ug via INTRAVENOUS
  Administered 2016-02-27: 70 ug via INTRAVENOUS
  Administered 2016-02-27: 130 ug via INTRAVENOUS
  Administered 2016-02-27: 90 ug via INTRAVENOUS
  Administered 2016-02-28: 60 ug via INTRAVENOUS
  Administered 2016-02-28: 80 ug via INTRAVENOUS
  Administered 2016-02-28: 12:00:00 via INTRAVENOUS
  Filled 2016-02-26 (×2): qty 25

## 2016-02-26 NOTE — Progress Notes (Signed)
eLink Physician-Brief Progress Note Patient Name: [name] Snow DOB: 04-14-1976 MRN: [id number]   Date of Service  02/26/2016  HPI/Events of Note  Patient c/o 8/10 pain in spite of Fentanyl PCA. Creatinine = 0.90  eICU Interventions  Will order Toradol 15 mg IV Q 6 hours PRN pain.      Intervention Category Intermediate Interventions: Pain - evaluation and management  [name] 02/26/2016, 7:23 PM

## 2016-02-26 NOTE — Progress Notes (Signed)
eLink Physician-Brief Progress Note Patient Name: [name] Snow DOB: Feb 25, 1976 MRN: [id number]   Date of Service  02/26/2016  HPI/Events of Note  Taking ice chips and sips without difficulty.  Good bowel sounds and 2 BMs.  eICU Interventions  Plan: Advance diet as tolerated     Intervention Category Minor Interventions: Routine modifications to care plan (e.g. PRN medications for pain, fever)  Therma Lasure 02/26/2016, 11:52 PM

## 2016-02-26 NOTE — Progress Notes (Signed)
Re-eval 4:16 PM   Meets exdtubation criteria  Plan Extubate  4:16 PM   cpapa qhs due to possible osa  Dr. Kalman ShanMurali Tavis Kring, M.D., [facility]C.P Pulmonary and Critical Care Medicine Staff Physician [facility] [facility] Pager: [phone], If no answer or between  15:00h - 7:00h: call [phone]  02/26/2016 4:16 PM

## 2016-02-26 NOTE — Progress Notes (Signed)
Pt foley removed per nurse driven protocol. Pt is due to void on 02/27/16 at 0630

## 2016-02-26 NOTE — Progress Notes (Signed)
PULMONARY / CRITICAL CARE MEDICINE   Name: [name] Snow MRN: [id number] DOB: April 29, 1976    ADMISSION DATE:  02/21/2016 CONSULTATION DATE: 02/23/2016  REFERRING MD:    CHIEF COMPLAINT:   HISTORY OF PRESENT ILLNESS:   40 year old male with undiagnosed OSA, initially presented to ED on 4/15 stating that he had right shoulder pain and had injured it 3 days prior by pulling a rope, was ruled out for any acute issues and was sent home with flexeril and ibuprofen. Returned on 4/17 to [facility] with increased pain to right shoulder stating that it had become harder to take a deep breath. Found to have CAP, possible underlying empyema, and right pleural effusion with consolidation. Started on Rocephin and Zithromax on 4/18, thoracentesis performed on 4/18 without complications while noting that the pleural effusion was loculated and appeared to be exudative. TCTs felt like a VATS was needed, transferred to [facility] for further evaluation. PCCM to admit to ICU to monitor respiratory status closely.   SUBJECTIVE:  More alert and weaning on vent this am Complains of abd distention and gas -TF started yest     VITAL SIGNS: BP 119/65 mmHg  Pulse 94  Temp(Src) 98.5 F (36.9 C) (Oral)  Resp 17  Ht  (1.905 m)  Wt 232 lb 2.3 oz (105.3 kg)  BMI 29.02 kg/m2  SpO2 100%  HEMODYNAMICS:    VENTILATOR SETTINGS: Vent Mode:  [-] PSV;CPAP FiO2 (%):  [40 %] 40 % Set Rate:  [22 bmp] 22 bmp Vt Set:  [510 mL] 510 mL PEEP:  [5 cmH20] 5 cmH20 Pressure Support:  [5 cmH20-18 cmH20] 5 cmH20 Plateau Pressure:  [22 cmH20-25 cmH20] 24 cmH20  INTAKE / OUTPUT: I/O last 3 completed shifts: In: 5325.4 [I.V.:2030.4; NG/GT:995; IV Piggyback:2300] Out: 3655 [Urine:3525; Chest Tube:130]  PHYSICAL EXAMINATION: General: Adult male Neuro: Alert and oriented, follows commands  HEENT: Normocephalic, atraumatic   Cardiovascular:  s1 s2 RRR  Lungs: ronchi improved, chest tubes x 3 Abdomen:  gen tend ,  hypoactive BS , mild distention  Musculoskeletal: no acute deformities  Skin: warm, dry, intact   LABS:  BMET  Recent Labs Lab 02/24/16 0249 02/25/16 0453 02/26/16 0213  NA 136 138 140  K 3.9 3.9 4.0  CL 101 101 102  CO2 BUN CREATININE 1.20 1.03 0.90  GLUCOSE 137* 137* 112*    Electrolytes  Recent Labs Lab 02/24/16 0249  02/25/16 0453 02/25/16 1217 02/25/16 2323 02/26/16 0213  CALCIUM 8.3*  --  7.7*  --   --  8.0*  MG 2.1  < > 2.1 2.2 2.2 2.0  PHOS 3.2  < > 3.8 4.2 3.5 3.6  < > = values in this interval not displayed.  CBC  Recent Labs Lab 02/24/16 0249 02/25/16 0453 02/26/16 0213  WBC 8.6 7.3 5.8  HGB 10.9* 11.6* 10.2*  HCT 32.7* 35.7* 32.7*  PLT 159 195 182    Coag's  Recent Labs Lab 02/24/16 0249  APTT 32  INR 1.33    Sepsis Markers  Recent Labs Lab 02/22/16 0231 02/22/16 0528 02/23/16 1153 02/24/16 0249  LATICACIDVEN 0.8 0.8  --   --   PROCALCITON 0.53  --  0.54 1.13    ABG  Recent Labs Lab 02/24/16 0347 02/24/16 1204 02/25/16 0330  PHART 7.325* 7.398 7.431  PCO2ART 53.4* 44.9 42.4  PO2ART 191.0* 94.0 122*    Liver Enzymes  Recent Labs Lab 02/22/16 0046 02/22/16  0335 02/26/16 0213  AST 19 17 54*  ALT 41 38 42  ALKPHOS 57 56 44  BILITOT 2.2* 1.6* 1.8*  ALBUMIN 3.5 3.4* 1.8*    Cardiac Enzymes  Recent Labs Lab 02/22/16 0046  TROPONINI <0.03    Glucose  Recent Labs Lab 02/25/16 1110 02/25/16 1617 02/25/16 1952 02/25/16 2349 02/26/16 0348 02/26/16 0831  GLUCAP 124* 117* 130* 151* 99 136*    Imaging Dg Chest Port 1 View  02/26/2016  CLINICAL DATA:  Status post evacuation of a right-sided empyema. EXAM: PORTABLE CHEST 1 VIEW COMPARISON:  02/25/2016 FINDINGS: The 3 right-sided chest tubes are stable. There is persistent opacity at the right lung base obscuring hemidiaphragm. There is central interstitial thickening and vascular congestion on the right accentuated by low lung volume.  This is also stable. Mild streaky subsegmental atelectasis at the left lung base. Left lung otherwise clear. Cardiac silhouette is normal in size. Endotracheal tube, oral/nasogastric tube and right internal jugular central venous line are stable and well positioned. No evidence of a pneumothorax. IMPRESSION: 1. No significant change from the previous day's study. 2. Persistent postoperative changes on the right. No convincing pneumothorax. 3. Right lung base opacity is likely combination of residual pleural fluid, atelectasis and possibly pneumonia. No pulmonary edema. 4. Support apparatus is stable and well positioned. Electronically Signed   By: Amie Portlandavid  Ormond M.D.   On: 02/26/2016 08:12     STUDIES:  4/17 CXR: Opacification of lower 1/2 of right hemithorax, likely pleural fluid and atelectasis or consolidation, ? Small left effusion   4/18 CT Chest: Moderate size pleural effusion with basilar consolidation, ?PNA 4/18 Thoracentesis  4/18 CXR: Large and loculated right pleural effusion suspicious for empyema  4/18 Echo: EF 55-60, no wall motion abnormalities, trivial tricuspid regurgitation    CULTURES: 4/18 Blood x 2 >> NG 4/18 Sputum >> No organisms 4/18 Right Lung  4/20 Pleural fluid >>>   ANTIBIOTICS: Zithromax 4/18 >>4/19 Rocephin 4/18>>4/19 vanc 4/19>>> Zosyn 4/19>>>  SIGNIFICANT EVENTS: 4/15: ED for right shoulder pain after pulling on rope 3 days prior > no acute findings d/c with flexeril and ibuprofen 4/17: ED for increased right shoulder pain, hard to take deep breath bc of pain  4/19: Worsening Respiratory Distress, RR 40s, Large Loculated Rt effusion- [facility]  4/20 VATS  LINES/TUBES: 4/19 ett>>> 4/20 rt IJ>>> 4/20 a line>>>  DISCUSSION: 40 year old male admitted for ?CAP, right pleural effusion, and ?underlying empyema. Underwent thoracentesis on 4/19 without complications that noted pleural effusion was loculated and appread to be  exudative. Given the increase oxygenation needs, CVTS was consulted and suggested a VATS would be needed. [facility] to [facility]. Will continue antibiotics, monitor closely in ICU for increasing respiratory distress, with work-up for VATS.   ASSESSMENT / PLAN:  PULMONARY A:  Acute Hypoxic/Hypercarbic Respiratory Failure CAP Loculated right pleural effusion - empyema s/p R  VATS 4/20  H/O OSA  P:   CVTS - VATS 4/20  3 CT in place On PRVC, wean as tolerated - weaning well 4/22 with good vol  Chest tube x 3 to 20 cm suction CXR with bilateral hypoinflation post-VATS     CARDIOVASCULAR A: Tachycardia HTN P:  Cardiac Monitoring  Pain control man issue  labetolol prn SBP >150  RENAL A:   No acute issues  P:   Electrolyte Replacement as needed  Serial BMP  Allow pos balance to even goals  GASTROINTESTINAL  A:   Concern aspiration ?post op ileus  P:   NPO Stop TF, OGT to sxn  kub to assess ileus    HEMATOLOGIC A:   No acute issues  dvt prevention P:  HIV - negative Sub q hep add  INFECTIOUS A:   CAP - empyema Severe sepsis Leukocytosis - resolved  Unimpressive pct P:   Maintain Vanc/Zosyn, follow OR cultures Cultures pending  ENDOCRINE A:   No acute issues  P:   Q4 CBG while NPO  Start SSI if CBG > 200 x2  NEUROLOGIC A:   Pain Agitation-improved  P:   RASS goal: 0  Propofol gtt Fentanyl for pain   FAMILY  - Updates: none  - Inter-disciplinary family meet or Palliative Care meeting due by: 03/01/2016  Julio Sicks, NP   [place] [facility] 02/26/2016 9:50 AM

## 2016-02-26 NOTE — Procedures (Signed)
Extubation Procedure Note  Patient Details:   Name: [name] Snow DOB: [birth year] MRN: [id number]   Airway Documentation:     Evaluation  O2 sats: stable throughout Complications: No apparent complications Patient did tolerate procedure well. Bilateral Breath Sounds: Rhonchi   Yes able to voice   Pt extubated at this time per Dr. Marchelle Gearingamaswamy order, placed on 4L [place] tolerating well, no distress noted, RT will monitor.  Cherylin MylarDoyle, Cordarryl Monrreal 02/26/2016, 4:24 PM

## 2016-02-26 NOTE — Progress Notes (Signed)
[address]       [place] [id number]             [phone]        CARDIOTHORACIC SURGERY PROGRESS NOTE   R2 Days Post-Op Procedure(s) (LRB): VIDEO ASSISTED THORACOSCOPY Right (VATS) for /EMPYEMA, decortication of lung (Right)  Subjective: Awake and alert on vent.  Complaining of abdominal gas and distension  Objective: Vital signs: BP Readings from Last 1 Encounters:  02/26/16 119/65   Pulse Readings from Last 1 Encounters:  02/26/16 94   Resp Readings from Last 1 Encounters:  02/26/16 17   Temp Readings from Last 1 Encounters:  02/26/16 98.5 F (36.9 C) Oral    Hemodynamics:    Physical Exam:  Rhythm:   sinus  Breath sounds: Coarse but o/w clear  Heart sounds:  RRR  Incisions:  Dressings dry, intact  Abdomen:  Distended but soft, non tender, decreased BS's  Extremities:  Warm, well-perfused  Chest tubes:  low volume thin serosanguinous output, no air leak    Intake/Output from previous day: 04/21 0701 - 04/22 0700 In: 3017.1 [I.V.:932.1; NG/GT:335; IV Piggyback:1750] Out: 2045 [Urine:2005; Chest Tube:40] Intake/Output this shift:    Lab Results:  CBC: Recent Labs  02/25/16 0453 02/26/16 0213  WBC 7.3 5.8  HGB 11.6* 10.2*  HCT 35.7* 32.7*  PLT 195 182    BMET:  Recent Labs  02/25/16 0453 02/26/16 0213  NA 138 140  K 3.9 4.0  CL 101 102  CO2 25 29  GLUCOSE 137* 112*  BUN 17 17  CREATININE 1.03 0.90  CALCIUM 7.7* 8.0*     PT/INR:   Recent Labs  02/24/16 0249  LABPROT 16.6*  INR 1.33    CBG (last 3)   Recent Labs  02/25/16 2349 02/26/16 0348 02/26/16 0831  GLUCAP 151* 99 136*    ABG    Component Value Date/Time   PHART 7.431 02/25/2016 0330   PCO2ART 42.4 02/25/2016 0330   PO2ART 122* 02/25/2016 0330   HCO3 27.5* 02/25/2016 0330   TCO2 28.8 02/25/2016 0330   ACIDBASEDEF 2.0 02/22/2016 0235   O2SAT 98.3 02/25/2016 0330    CXR: PORTABLE CHEST 1 VIEW  COMPARISON:  02/25/2016  FINDINGS: The 3 right-sided chest tubes are stable. There is persistent opacity at the right lung base obscuring hemidiaphragm. There is central interstitial thickening and vascular congestion on the right accentuated by low lung volume. This is also stable.  Mild streaky subsegmental atelectasis at the left lung base. Left lung otherwise clear.  Cardiac silhouette is normal in size.  Endotracheal tube, oral/nasogastric tube and right internal jugular central venous line are stable and well positioned.  No evidence of a pneumothorax.  IMPRESSION: 1. No significant change from the previous day's study. 2. Persistent postoperative changes on the right. No convincing pneumothorax. 3. Right lung base opacity is likely combination of residual pleural fluid, atelectasis and possibly pneumonia. No pulmonary edema. 4. Support apparatus is stable and well positioned.   Electronically Signed  By: Amie Portlandavid Ormond M.D.  On: 02/26/2016 08:12   Assessment/Plan: S/P Procedure(s) (LRB): VIDEO ASSISTED THORACOSCOPY Right (VATS) for /EMPYEMA, decortication of lung (Right)  Overall stable but still on vent Minimal chest tube output and no air leak Distended abdomen, receiving tube feeds despite the fact he just had a rather large surgical procedure and likely has at least mild postop ileus - patient has been started on a feeding protocol that was designed for  medical patients, not patients in early postop state   Will hold tube feeds and place OG tube to suction  Leave chest tubes in place  Continue antibiotics  Remainder of care per Pulm/CCM team  Purcell Nails, MD 02/26/2016 9:18 AM

## 2016-02-27 ENCOUNTER — Inpatient Hospital Stay (HOSPITAL_COMMUNITY): Payer: Self-pay

## 2016-02-27 LAB — BASIC METABOLIC PANEL
ANION GAP: 10 (ref 5–15)
BUN: 24 mg/dL — ABNORMAL HIGH (ref 6–20)
CHLORIDE: 99 mmol/L — AB (ref 101–111)
CO2: 32 mmol/L (ref 22–32)
CREATININE: 1.01 mg/dL (ref 0.61–1.24)
Calcium: 8.2 mg/dL — ABNORMAL LOW (ref 8.9–10.3)
GFR calc non Af Amer: >60 mL/min (ref >60)
Glucose, Bld: 124 mg/dL — ABNORMAL HIGH (ref 65–99)
Potassium: 3.4 mmol/L — ABNORMAL LOW (ref 3.5–5.1)
Sodium: 141 mmol/L (ref 135–145)

## 2016-02-27 LAB — CBC WITH DIFFERENTIAL/PLATELET
BASOS ABS: 0 10*3/uL (ref 0.0–0.1)
Basophils Relative: 0 %
Eosinophils Absolute: 0.2 10*3/uL (ref 0.0–0.7)
Eosinophils Relative: 3 %
HEMATOCRIT: 33 % — AB (ref 39.0–52.0)
HEMOGLOBIN: 10.3 g/dL — AB (ref 13.0–17.0)
LYMPHS PCT: 22 %
Lymphs Abs: 1.3 10*3/uL (ref 0.7–4.0)
MCH: 28.5 pg (ref 26.0–34.0)
MCHC: 31.2 g/dL (ref 30.0–36.0)
MCV: 91.2 fL (ref 78.0–100.0)
Monocytes Absolute: 0.6 10*3/uL (ref 0.1–1.0)
Monocytes Relative: 10 %
NEUTROS ABS: 3.8 10*3/uL (ref 1.7–7.7)
Neutrophils Relative %: 65 %
Platelets: 231 10*3/uL (ref 150–400)
RBC: 3.62 MIL/uL — ABNORMAL LOW (ref 4.22–5.81)
RDW: 15.2 % (ref 11.5–15.5)
WBC: 5.8 10*3/uL (ref 4.0–10.5)

## 2016-02-27 LAB — GLUCOSE, CAPILLARY
GLUCOSE-CAPILLARY: 138 mg/dL — AB (ref 65–99)
GLUCOSE-CAPILLARY: 117 mg/dL — AB (ref 65–99)
Glucose-Capillary: 110 mg/dL — ABNORMAL HIGH (ref 65–99)
Glucose-Capillary: 112 mg/dL — ABNORMAL HIGH (ref 65–99)
Glucose-Capillary: 136 mg/dL — ABNORMAL HIGH (ref 65–99)

## 2016-02-27 LAB — PHOSPHORUS: PHOSPHORUS: 3.8 mg/dL (ref 2.5–4.6)

## 2016-02-27 LAB — MAGNESIUM: MAGNESIUM: 2.1 mg/dL (ref 1.7–2.4)

## 2016-02-27 LAB — VANCOMYCIN, TROUGH: VANCOMYCIN TR: 21 ug/mL — AB (ref 10.0–20.0)

## 2016-02-27 MED ORDER — POTASSIUM CHLORIDE CRYS ER 20 MEQ PO TBCR
40.0000 meq | EXTENDED_RELEASE_TABLET | Freq: Once | ORAL | Status: AC
  Administered 2016-02-27: 40 meq via ORAL
  Filled 2016-02-27: qty 2

## 2016-02-27 MED ORDER — PNEUMOCOCCAL VAC POLYVALENT 25 MCG/0.5ML IJ INJ: 0.5000 mL | INJECTION | INTRAMUSCULAR | Status: DC

## 2016-02-27 MED ORDER — PNEUMOCOCCAL VAC POLYVALENT 25 MCG/0.5ML IJ INJ: 0.5000 mL | INJECTION | INTRAMUSCULAR | Status: DC | PRN

## 2016-02-27 MED ORDER — ACETAMINOPHEN 500 MG PO TABS
1000.0000 mg | ORAL_TABLET | Freq: Four times a day (QID) | ORAL | Status: AC
  Administered 2016-02-27 – 2016-02-29 (×8): 1000 mg via ORAL
  Filled 2016-02-27 (×8): qty 2

## 2016-02-27 NOTE — Progress Notes (Signed)
PULMONARY / CRITICAL CARE MEDICINE   Name: [name] Snow MRN: [id number] DOB: [birth year]    ADMISSION DATE:  02/21/2016 CONSULTATION DATE: 02/23/2016  REFERRING MD:    CHIEF COMPLAINT:   HISTORY OF PRESENT ILLNESS:   40 year old male with undiagnosed OSA, initially presented to ED on 4/15 stating that he had right shoulder pain and had injured it 3 days prior by pulling a rope, was ruled out for any acute issues and was sent home with flexeril and ibuprofen. Returned on 4/17 to [facility] with increased pain to right shoulder stating that it had become harder to take a deep breath. Found to have CAP, possible underlying empyema, and right pleural effusion with consolidation. Started on Rocephin and Zithromax on 4/18, thoracentesis performed on 4/18 without complications while noting that the pleural effusion was loculated and appeared to be exudative. TCTs felt like a VATS was needed, transferred to [facility] for further evaluation. PCCM to admit to ICU to monitor respiratory status closely.   SUBJECTIVE:  Alert, sitting up in chair. Only complains of mild right sided soreness. No sob, abdominal pain. Reports having bowel movements yesterday.    VITAL SIGNS: BP 114/67 mmHg  Pulse 89  Temp(Src) 98.7 F (37.1 C) (Oral)  Resp 32  Ht  (1.905 m)  Wt 232 lb 2.3 oz (105.3 kg)  BMI 29.02 kg/m2  SpO2 98%  HEMODYNAMICS:    VENTILATOR SETTINGS: Vent Mode:  [-] PSV;CPAP FiO2 (%):  [40 %] 40 % PEEP:  [5 cmH20] 5 cmH20 Pressure Support:  [5 cmH20] 5 cmH20  INTAKE / OUTPUT: I/O last 3 completed shifts: In: 5063.4 [P.O.:900; I.V.:1051.4; NG/GT:175; IV Piggyback:2937] Out: 5531 [Urine:5380; Stool:1; Chest Tube:150]  PHYSICAL EXAMINATION: General: Adult male Neuro: Alert and oriented x4, no focal deficits HEENT: Normocephalic, atraumatic   Cardiovascular:  s1 s2 RRR  Lungs: decreased breath sounds on right, no rhonchi, chest tubes x 3 Abdomen:  gen tend , hypoactive BS , mild  distention  Musculoskeletal: no acute deformities  Skin: warm, dry, intact   LABS:  BMET  Recent Labs Lab 02/25/16 0453 02/26/16 0213 02/27/16 0415  NA 138 140 141  K 3.9 4.0 3.4*  CL 101 102 99*  CO2 25 29 32  BUN 17 17 24*  CREATININE 1.03 0.90 1.01  GLUCOSE 137* 112* 124*    Electrolytes  Recent Labs Lab 02/25/16 0453  02/25/16 2323 02/26/16 0213 02/27/16 0415  CALCIUM 7.7*  --   --  8.0* 8.2*  MG 2.1  < > 2.2 2.0 2.1  PHOS 3.8  < > 3.5 3.6 3.8  < > = values in this interval not displayed.  CBC  Recent Labs Lab 02/25/16 0453 02/26/16 0213 02/27/16 0415  WBC 7.3 5.8 5.8  HGB 11.6* 10.2* 10.3*  HCT 35.7* 32.7* 33.0*  PLT 195 182 231    Coag's  Recent Labs Lab 02/24/16 0249  APTT 32  INR 1.33    Sepsis Markers  Recent Labs Lab 02/22/16 0231 02/22/16 0528 02/23/16 1153 02/24/16 0249  LATICACIDVEN 0.8 0.8  --   --   PROCALCITON 0.53  --  0.54 1.13    ABG  Recent Labs Lab 02/24/16 0347 02/24/16 1204 02/25/16 0330  PHART 7.325* 7.398 7.431  PCO2ART 53.4* 44.9 42.4  PO2ART 191.0* 94.0 122*    Liver Enzymes  Recent Labs Lab 02/22/16 0046 02/22/16 0335 02/26/16 0213  AST 19 17 54*  ALT 41 38 42  ALKPHOS 57 56 44  BILITOT 2.2* 1.6* 1.8*  ALBUMIN 3.5 3.4* 1.8*    Cardiac Enzymes  Recent Labs Lab 02/22/16 0046  TROPONINI <0.03    Glucose  Recent Labs Lab 02/26/16 0831 02/26/16 1237 02/26/16 1617 02/26/16 1958 02/26/16 2348 02/27/16 0356  GLUCAP 136* 105* 108* 112* 110* 112*    Imaging Dg Abd Portable 1v  02/26/2016  CLINICAL DATA:  Abdominal pain, distension and gas s/p VATS on 02/24/2016. EXAM: PORTABLE ABDOMEN - 1 VIEW COMPARISON:  02/26/2016 FINDINGS: Nasogastric tube is in place, tip overlying the level of the third portion of the duodenum. There is mild dilatation of central small bowel loops. Gas is identified within nondilated loops of large bowel. No evidence for free intraperitoneal air. Right-sided  chest tube is identified at the lung base. IMPRESSION: Nasogastric tube tip overlying the level of the third portion of duodenum. Mildly dilated central small bowel loops. Electronically Signed   By: Norva PavlovElizabeth  Brown M.D.   On: 02/26/2016 10:28     STUDIES:  4/17 CXR: Opacification of lower 1/2 of right hemithorax, likely pleural fluid and atelectasis or consolidation, ? Small left effusion   4/18 CT Chest: Moderate size pleural effusion with basilar consolidation, ?PNA 4/18 Thoracentesis  4/18 CXR: Large and loculated right pleural effusion suspicious for empyema  4/18 Echo: EF 55-60, no wall motion abnormalities, trivial tricuspid regurgitation    CULTURES: 4/18 Blood x 2 >> NG 4/18 Sputum >> No organisms 4/18 Right Lung >> NG 4/20 Pleural fluid >> NG  ANTIBIOTICS: Zithromax 4/18 >>4/19 Rocephin 4/18>>4/19 vanc 4/19>>> Zosyn 4/19>>>  SIGNIFICANT EVENTS: 4/15: ED for right shoulder pain after pulling on rope 3 days prior > no acute findings d/c with flexeril and ibuprofen 4/17: ED for increased right shoulder pain, hard to take deep breath bc of pain  4/19: Worsening Respiratory Distress, RR 40s, Large Loculated Rt effusion- [facility]  4/20 VATS  LINES/TUBES: 4/19 ett>>> 4/22 4/20 rt IJ>>> 4/20 a line>>>4/22 4/20 chest tubes x3 >>  DISCUSSION: 40 year old male admitted for ?CAP, right pleural effusion, and ?underlying empyema. Underwent thoracentesis on 4/19 without complications that noted pleural effusion was loculated and appread to be exudative. Given the increase oxygenation needs, CVTS was consulted and suggested a VATS would be needed. [facility] to [facility]. Will continue antibiotics, monitor closely in ICU for increasing respiratory distress, with work-up for VATS.   ASSESSMENT / PLAN:  PULMONARY A:  Acute Hypoxic/Hypercarbic Respiratory Failure CAP Loculated right pleural effusion - empyema s/p R  VATS 4/20   H/O OSA  P:   CVTS - VATS 4/20  3 CT in place Chest tube x 3 to 20 cm suction Extubated 4/22, doing well on 2L     CARDIOVASCULAR A: Tachycardia - resolved HTN - resolved with pain control P:  Cardiac Monitoring  Labetolol prn SBP >150  RENAL A:   No acute issues  P:   Electrolyte Replacement as needed  Serial BMP   GASTROINTESTINAL A:   Concern aspiration ?post op ileus  P:   kub 4/22 with mildly dilated central small bowel loops 2x BM yesterday  CXR pending  HEMATOLOGIC A:   No acute issues  dvt prevention P:  HIV - negative Sub q hep add  INFECTIOUS A:   CAP - empyema Severe sepsis Leukocytosis - resolved  Unimpressive pct P:   Cultures not showing any growth On vanc/zosyn - consider de escalation   ENDOCRINE A:  No acute issues  P:   Start SSI if CBG > 200 x2  NEUROLOGIC A:   Pain Agitation-improved  P:   RASS goal: 0  Fentanyl PCA toradol prn  FAMILY  - Updates: none  - Inter-disciplinary family meet or Palliative Care meeting due by: 03/01/2016  Valentino Nose, MD IMTS PGY-1 [phone]

## 2016-02-27 NOTE — Progress Notes (Addendum)
      [address]       [place] [id number]             [phone]      3 Days Post-Op Procedure(s) (LRB): VIDEO ASSISTED THORACOSCOPY Right (VATS) for /EMPYEMA, decortication of lung (Right)   Subjective:  No new complaints.  Up in chair and states feeling a little better.  Continues to have pain at chest tube sites.  Objective: Vital signs in last 24 hours: Temp:  [97.9 F (36.6 C)-98.7 F (37.1 C)] 98.4 F (36.9 C) (04/23 0818) Pulse Rate:  [82-110] 89 (04/23 0634) Cardiac Rhythm:  [-] Normal sinus rhythm (04/23 0400) Resp:  [18-35] 32 (04/23 0634) BP: (110-132)/(62-83) 114/67 mmHg (04/23 0634) SpO2:  [95 %-99 %] 95 % (04/23 0835) FiO2 (%):  [40 %] 40 % (04/22 1600)  Intake/Output from previous day: 04/22 0701 - 04/23 0700 In: 3308 [P.O.:900; I.V.:589.5; IV Piggyback:1818.5] Out: 4576 [Urine:4425; Stool:1; Chest Tube:150]  General appearance: alert, cooperative and no distress Heart: regular rate and rhythm Lungs: diminished breath sounds bibasilar Abdomen: soft, non-tender; bowel sounds normal; no masses,  no organomegaly Wound: clean andd ry  Lab Results:  Recent Labs  02/26/16 0213 02/27/16 0415  WBC 5.8 5.8  HGB 10.2* 10.3*  HCT 32.7* 33.0*  PLT 182 231   BMET:  Recent Labs  02/26/16 0213 02/27/16 0415  NA 140 141  K 4.0 3.4*  CL 102 99*  CO2 29 32  GLUCOSE 112* 124*  BUN 17 24*  CREATININE 0.90 1.01  CALCIUM 8.0* 8.2*    PT/INR: No results for input(s): LABPROT, INR in the last 72 hours. ABG    Component Value Date/Time   PHART 7.431 02/25/2016 0330   HCO3 27.5* 02/25/2016 0330   TCO2 28.8 02/25/2016 0330   ACIDBASEDEF 2.0 02/22/2016 0235   O2SAT 98.3 02/25/2016 0330   CBG (last 3)   Recent Labs  02/26/16 2348 02/27/16 0356 02/27/16 0819  GLUCAP 110* 112* 138*    Assessment/Plan: S/P Procedure(s) (LRB): VIDEO ASSISTED THORACOSCOPY Right (VATS) for /EMPYEMA, decortication of lung (Right)  1. Chest tube-  no air leak, output remains minimal- will d/c anterior chest tube today 2. Pulm- extubated this morning, up in chair continue pulm toilet 3. ID- Empyema, OR cultures remain negative on Vanc, Zosyn... Remain afebrile and no leukocytosis 4. Dispo- care per primary   LOS: 5 days    BARRETT, ERIN 02/27/2016  I have seen and examined the patient and agree with the assessment and plan as outlined.  D/C anterior chest tube.  Mobilize.  Looks okay for transfer to 3S stepdown.    Purcell Nailslarence H Owen, MD 02/27/2016 12:27 PM

## 2016-02-28 ENCOUNTER — Inpatient Hospital Stay (HOSPITAL_COMMUNITY): Payer: Self-pay

## 2016-02-28 LAB — COMPREHENSIVE METABOLIC PANEL
ALBUMIN: 2.1 g/dL — AB (ref 3.5–5.0)
ALK PHOS: 45 U/L (ref 38–126)
ALT: 45 U/L (ref 17–63)
ANION GAP: 9 (ref 5–15)
AST: 74 U/L — ABNORMAL HIGH (ref 15–41)
BILIRUBIN TOTAL: 1.6 mg/dL — AB (ref 0.3–1.2)
BUN: 17 mg/dL (ref 6–20)
CALCIUM: 7.9 mg/dL — AB (ref 8.9–10.3)
CO2: 24 mmol/L (ref 22–32)
Chloride: 108 mmol/L (ref 101–111)
Creatinine, Ser: 0.92 mg/dL (ref 0.61–1.24)
GLUCOSE: 124 mg/dL — AB (ref 65–99)
POTASSIUM: 5 mmol/L (ref 3.5–5.1)
Sodium: 141 mmol/L (ref 135–145)
TOTAL PROTEIN: 4.9 g/dL — AB (ref 6.5–8.1)

## 2016-02-28 LAB — CULTURE, RESPIRATORY: SPECIAL REQUESTS: NORMAL

## 2016-02-28 LAB — CULTURE, RESPIRATORY W GRAM STAIN: Culture: NORMAL

## 2016-02-28 LAB — TISSUE CULTURE: Culture: NO GROWTH

## 2016-02-28 LAB — CULTURE, BLOOD (ROUTINE X 2)
Culture: NO GROWTH
Culture: NO GROWTH

## 2016-02-28 LAB — CULTURE, BODY FLUID W GRAM STAIN -BOTTLE

## 2016-02-28 LAB — TYPE AND SCREEN
ABO/RH(D): A NEG
Antibody Screen: NEGATIVE
Unit division: 0
Unit division: 0

## 2016-02-28 LAB — CULTURE, BODY FLUID-BOTTLE: CULTURE: NO GROWTH

## 2016-02-28 MED ORDER — LEVALBUTEROL HCL 0.63 MG/3ML IN NEBU: 0.6300 mg | INHALATION_SOLUTION | RESPIRATORY_TRACT | Status: DC | PRN

## 2016-02-28 MED ORDER — OXYCODONE HCL 5 MG PO TABS
5.0000 mg | ORAL_TABLET | ORAL | Status: DC | PRN
  Administered 2016-02-28: 5 mg via ORAL
  Administered 2016-02-28 – 2016-03-01 (×6): 10 mg via ORAL
  Administered 2016-03-01: 5 mg via ORAL
  Administered 2016-03-01: 10 mg via ORAL
  Administered 2016-03-01: 5 mg via ORAL
  Administered 2016-03-01 – 2016-03-02 (×2): 10 mg via ORAL
  Administered 2016-03-02: 5 mg via ORAL
  Administered 2016-03-02: 10 mg via ORAL
  Filled 2016-02-28: qty 1
  Filled 2016-02-28 (×5): qty 2
  Filled 2016-02-28: qty 1
  Filled 2016-02-28: qty 2
  Filled 2016-02-28: qty 1
  Filled 2016-02-28 (×4): qty 2

## 2016-02-28 MED ORDER — SENNOSIDES-DOCUSATE SODIUM 8.6-50 MG PO TABS
1.0000 | ORAL_TABLET | Freq: Two times a day (BID) | ORAL | Status: DC
  Administered 2016-03-01: 1 via ORAL
  Filled 2016-02-28 (×3): qty 1

## 2016-02-28 MED ORDER — OXYCODONE HCL 5 MG PO TABS
5.0000 mg | ORAL_TABLET | ORAL | Status: DC | PRN
  Administered 2016-02-28: 5 mg via ORAL
  Filled 2016-02-28 (×2): qty 1

## 2016-02-28 MED ORDER — KETOROLAC TROMETHAMINE 15 MG/ML IJ SOLN
15.0000 mg | Freq: Once | INTRAMUSCULAR | Status: AC
  Administered 2016-02-28: 15 mg via INTRAVENOUS
  Filled 2016-02-28: qty 1

## 2016-02-28 MED ORDER — FENTANYL CITRATE (PF) 100 MCG/2ML IJ SOLN
25.0000 ug | INTRAMUSCULAR | Status: DC | PRN
  Administered 2016-02-28 – 2016-03-02 (×8): 50 ug via INTRAVENOUS
  Filled 2016-02-28 (×8): qty 2

## 2016-02-28 MED ORDER — OXYCODONE HCL 5 MG PO TABS: 5.0000 mg | ORAL_TABLET | ORAL | Status: DC | PRN

## 2016-02-28 NOTE — Progress Notes (Signed)
Pharmacy Antibiotic Note  [name] Snow is a 40 y.o. male admitted on 02/21/2016 with pneumonia. Pt on Zosyn (Day #5/8). AF, wbc wnl.  Plan: Zosyn 3.375g IV q8h Monitor clinical progress, c/s, renal function, LOT   Height: 6\' 1"  (185.4 cm) Weight: 240 lb (108.863 kg) IBW/kg (Calculated) : 79.9  Temp (24hrs), Avg:98.7 F (37.1 C), Min:98.1 F (36.7 C), Max:100.2 F (37.9 C)   Recent Labs Lab 02/22/16 0231  02/22/16 0528 02/23/16 0917 02/24/16 0249 02/25/16 0453 02/26/16 0213 02/27/16 0415 02/27/16 1126 02/28/16 0344  WBC  --   < >  --  9.5 8.6 7.3 5.8 5.8  --   --   CREATININE  --   < >  --  0.81 1.20 1.03 0.90 1.01  --  0.92  LATICACIDVEN 0.8  --  0.8  --   --   --   --   --   --   --   VANCOTROUGH  --   --   --   --   --  7*  --   --  21*  --   < > = values in this interval not displayed.  Estimated Creatinine Clearance: 142.3 mL/min (by C-G formula based on Cr of 0.92).    No Known Allergies  Antimicrobials this admission: Ceftriaxone 4/18 >> 4/20 Azithromycin 4/18 >> 4/20 Vancomycin 4/18 2gm IV x1 dose Vancomycin 4/20 >>4/23 Zosyn 4/20>>  Dose adjustments this admission:    VT 7 on 1g q8h - inc to 1750g q8    VT 21 on 1750 q8  Microbiology results: 4/18 R-lung pleural fluid >>ngf 4/18 Blood >>ngf 4/18 MRSA pcr negative 4/18 Strep pneumo UA negative 4/18 Flu negative 4/18 HIV negative 4/20 R lung tissue cx >>ngtd 4/20 R lung pleural fluid: ngtd 4/19 TA: rare GPC  Babs BertinHaley Jazira Maloney, PharmD, BCPS Clinical Pharmacist Pager [phone] 02/28/2016 11:35 AM

## 2016-02-28 NOTE — Progress Notes (Signed)
22mcg fentanyl from old syringe wasted in sink with RN Scherrie BatemanShraddha Khokhal due to PCA pump discontinuation, empty syringe placed in sharps container.

## 2016-02-28 NOTE — Progress Notes (Signed)
eLink Physician-Brief Progress Note Patient Name: [name] SplinterJames [name] DOB: Jan 05, 1976 MRN: [id number]   Date of Service  02/28/2016  HPI/Events of Note  Bedside nurse reports patient complaining of significant pain from chest tube. Toradol discontinued earlier today. Currently has PCA pump.Renal function stable.  eICU Interventions  Toradol 15 mg IV 1 dose now     Intervention Category Intermediate Interventions: Pain - evaluation and management  [name] 02/28/2016, 1:49 AM

## 2016-02-28 NOTE — Progress Notes (Signed)
3mcg fentanyl from old syringe wasted in sink with RN Villa HerbNicole Waller when replacing syringe for noon administration. Empty syringe placed in sharps container.

## 2016-02-28 NOTE — Progress Notes (Addendum)
      [address]       [place] [id number]             [phone]      4 Days Post-Op Procedure(s) (LRB): VIDEO ASSISTED THORACOSCOPY Right (VATS) for /EMPYEMA, decortication of lung (Right)   Subjective:  Complains of pain at night, difficulty getting comfortable  Objective: Vital signs in last 24 hours: Temp:  [98.1 F (36.7 C)-100.2 F (37.9 C)] 98.3 F (36.8 C) (04/24 0830) Pulse Rate:  [71-105] 71 (04/24 0830) Cardiac Rhythm:  [-] Normal sinus rhythm (04/24 0830) Resp:  [18-35] 25 (04/24 0800) BP: (103-138)/(58-90) 118/90 mmHg (04/24 0830) SpO2:  [94 %-100 %] 100 % (04/24 0800) FiO2 (%):  [40 %] 40 % (04/24 0403) Weight:  [240 lb (108.863 kg)] 240 lb (108.863 kg) (04/23 1808)  Intake/Output from previous day: 04/23 0701 - 04/24 0700 In: 1157 [P.O.:720; I.V.:274.5; IV Piggyback:162.5] Out: 2100 [Urine:2100] Intake/Output this shift: Total I/O In: -  Out: 200 [Urine:200]  General appearance: alert, cooperative and no distress Heart: regular rate and rhythm Lungs: diminished breath sounds bibasilar Abdomen: soft, non-tender; bowel sounds normal; no masses,  no organomegaly Wound: clean and dry   Lab Results:  Recent Labs  02/26/16 0213 02/27/16 0415  WBC 5.8 5.8  HGB 10.2* 10.3*  HCT 32.7* 33.0*  PLT 182 231   BMET:  Recent Labs  02/27/16 0415 02/28/16 0344  NA 141 141  K 3.4* 5.0  CL 99* 108  CO2 32 24  GLUCOSE 124* 124*  BUN 24* 17  CREATININE 1.01 0.92  CALCIUM 8.2* 7.9*    PT/INR: No results for input(s): LABPROT, INR in the last 72 hours. ABG    Component Value Date/Time   PHART 7.431 02/25/2016 0330   HCO3 27.5* 02/25/2016 0330   TCO2 28.8 02/25/2016 0330   ACIDBASEDEF 2.0 02/22/2016 0235   O2SAT 98.3 02/25/2016 0330   CBG (last 3)   Recent Labs  02/27/16 0819 02/27/16 1224 02/27/16 1612  GLUCAP 138* 117* 136*    Assessment/Plan: S/P Procedure(s) (LRB): VIDEO ASSISTED THORACOSCOPY Right (VATS) for  /EMPYEMA, decortication of lung (Right)  1. Chest tube- no air leak present, 150 cc output- leave chest tubes in place today, may be able to remove one tomorrow... CXR stable post chest tube removal 2. ID- empyema, on IV ABX OR cultures remain negative 3. Pain control-using PCA, will add Oxy IR 4. Dispo- care per primary, repeat CXR in AM   LOS: 6 days    BARRETT, ERIN 02/28/2016  patient examined and medical record reviewed,agree with above note. Kathlee Nationseter Van Trigt III 02/29/2016

## 2016-02-28 NOTE — Progress Notes (Signed)
[place] TEAM 1 - Stepdown/ICU TEAM PROGRESS NOTE  [name] Snow WUJ:[id number] DOB: 12/19/1975 DOA: 02/21/2016 PCP: No primary care provider on file.  Admit HPI / Brief Narrative: 40 year old male with suspected OSA who initially presented to ED on 4/15 stating that he had right shoulder pain after and injury 3 days prior when pulling a rope.  He was ruled out for any acute issues and sent home with flexeril and ibuprofen. Returned on 4/17 to [facility] with increased pain to right shoulder stating that it had become harder to take a deep breath. Found to have CAP, possible underlying empyema, and right pleural effusion with consolidation. Started on Rocephin and Zithromax on 4/18, thoracentesis performed on 4/18 without complications while noting that the pleural effusion was loculated and appeared to be exudative. Transferred to [facility] for TCTS evaluation.   Significant Events: 4/15 ED for right shoulder pain > no acute findings d/c with flexeril and ibuprofen 4/17  ED for increased right shoulder pain, hard to take deep breath bc of pain  4/19  Worsening Respiratory Distress, RR 40s, Large Loculated Rt effusion - transferred to [facility] for VATS  4/20 VATS  HPI/Subjective: Pt is resting in bed.  His PCA is currently not running due to an IV failure.  He describes a moderate amount of pain in his R chest, but tells me he is tolerating it well.  He denies n/v, abdom pain, or sob.    Assessment/Plan:  Severe sepsis w/ Acute Hypoxic/Hypercarbic Respiratory Failure due to PNA w/ R empyema  -s/p R VATS 4/20 -extubated 4/22, doing well on 2L [place] -post VATS care per TCTS   OSA  Cont QHS CPAP   HTN BP currently well controlled   Post op ileus   Appears to have resolved - follow closely w/ ongoing narcotic dosing   Code Status: FULL Family Communication: no family present at time of exam Disposition Plan: SDU w/ chest tubes in place   Consultants: PCCM   TCTS  Antibiotics: Zithromax 4/18 > 4/19 Rocephin 4/18 > 4/19 Vanc 4/19 > Zosyn 4/19 >  DVT prophylaxis: SQ heparin   Objective: Blood pressure 116/81, pulse 71, temperature 97.5 F (36.4 C), temperature source Axillary, resp. rate 22, height  (1.854 m), weight 108.863 kg (240 lb), SpO2 98 %.  Intake/Output Summary (Last 24 hours) at 02/28/16 1430 Last data filed at 02/28/16 1000  Gross per 24 hour  Intake    420 ml  Output   1550 ml  Net  -1130 ml    Exam: General: No acute respiratory distress Lungs: No wheezes or crackles appreciated  Cardiovascular: Regular rate and rhythm without murmur gallop or rub normal S1 and S2 Abdomen: Nontender, nondistended, soft, bowel sounds positive, no rebound, no ascites, no appreciable mass Extremities: No significant cyanosis, clubbing, or edema bilateral lower extremities  Data Reviewed: Basic Metabolic Panel:  Recent Labs Lab 02/24/16 0249  02/25/16 0453 02/25/16 1217 02/25/16 2323 02/26/16 0213 02/27/16 0415 02/28/16 0344  NA 136  --  138  --   --  140 141 141  K 3.9  --  3.9  --   --  4.0 3.4* 5.0  CL 101  --  101  --   --  102 99* 108  CO2 22  --  25  --   --  29 32 24  GLUCOSE 137*  --  137*  --   --  112* 124* 124*  BUN 19  --  17  --   --  17 24* 17  CREATININE 1.20  --  1.03  --   --  0.90 1.01 0.92  CALCIUM 8.3*  --  7.7*  --   --  8.0* 8.2* 7.9*  MG 2.1  < > 2.1 2.2 2.2 2.0 2.1  --   PHOS 3.2  < > 3.8 4.2 3.5 3.6 3.8  --   < > = values in this interval not displayed.  CBC:  Recent Labs Lab 02/22/16 0329 02/23/16 0917 02/24/16 0249 02/25/16 0453 02/26/16 0213 02/27/16 0415  WBC 11.1* 9.5 8.6 7.3 5.8 5.8  NEUTROABS 9.3*  --  6.8 5.6 4.2 3.8  HGB 12.6* 12.2* 10.9* 11.6* 10.2* 10.3*  HCT 36.3* 36.2* 32.7* 35.7* 32.7* 33.0*  MCV 87.7 89.4 88.4 89.0 91.6 91.2  PLT 143* 155 159 195 182 231    Liver Function Tests:  Recent Labs Lab 02/22/16 0046 02/22/16 0335 02/26/16 0213 02/28/16 0344   AST 19 17 54* 74*  ALT 41 38 42 45  ALKPHOS 57 56 44 45  BILITOT 2.2* 1.6* 1.8* 1.6*  PROT 6.8 6.4* 4.7* 4.9*  ALBUMIN 3.5 3.4* 1.8* 2.1*   Coags:  Recent Labs Lab 02/24/16 0249  INR 1.33    Recent Labs Lab 02/24/16 0249  APTT 32    Cardiac Enzymes:  Recent Labs Lab 02/22/16 0046  TROPONINI <0.03    CBG:  Recent Labs Lab 02/26/16 2348 02/27/16 0356 02/27/16 0819 02/27/16 1224 02/27/16 1612  GLUCAP 110* 112* 138* 117* 136*    Recent Results (from the past 240 hour(s))  Culture, blood (Routine X 2) w Reflex to ID Panel     Status: None   Collection Time: 02/22/16  3:00 AM  Result Value Ref Range Status   Specimen Description BLOOD RIGHT ANTECUBITAL  Final   Special Requests   Final    BOTTLES DRAWN AEROBIC AND ANAEROBIC AEB 10CC ANA 6CC   Culture NO GROWTH 6 DAYS  Final   Report Status 02/28/2016 FINAL  Final  MRSA PCR Screening     Status: None   Collection Time: 02/22/16  3:25 AM  Result Value Ref Range Status   MRSA by PCR NEGATIVE NEGATIVE Final    Comment:        The GeneXpert MRSA Assay (FDA approved for NASAL specimens only), is one component of a comprehensive MRSA colonization surveillance program. It is not intended to diagnose MRSA infection nor to guide or monitor treatment for MRSA infections.   Culture, blood (Routine X 2) w Reflex to ID Panel     Status: None   Collection Time: 02/22/16  3:35 AM  Result Value Ref Range Status   Specimen Description BLOOD LEFT HAND  Final   Special Requests   Final    BOTTLES DRAWN AEROBIC AND ANAEROBIC AEB 12CC ANA 8CC   Culture NO GROWTH 6 DAYS  Final   Report Status 02/28/2016 FINAL  Final  Culture, body fluid-bottle     Status: None   Collection Time: 02/22/16  8:15 AM  Result Value Ref Range Status   Specimen Description LUNG RIGHT  Final   Special Requests NONE 10CC EACH  Final   Culture NO GROWTH 6 DAYS  Final   Report Status 02/28/2016 FINAL  Final  Gram stain     Status: None  (Preliminary result)   Collection Time: 02/22/16  8:15 AM  Result Value Ref Range Status   Specimen Description LUNG RIGHT  Final  Special Requests NONE  Final   Gram Stain   Final    NO ORGANISMS SEEN ABUNDANT WBC PRESENT, PREDOMINANTLY PMN    Report Status PENDING  Incomplete  Culture, respiratory (NON-Expectorated)     Status: None   Collection Time: 02/23/16  8:22 PM  Result Value Ref Range Status   Specimen Description TRACHEAL ASPIRATE  Final   Special Requests Normal  Final   Gram Stain   Final    MODERATE WBC PRESENT,BOTH PMN AND MONONUCLEAR NO SQUAMOUS EPITHELIAL CELLS SEEN RARE GRAM POSITIVE COCCI IN PAIRS Performed at [facility]    Culture   Final    NORMAL OROPHARYNGEAL FLORA Performed at [facility]    Report Status 02/28/2016 FINAL  Final  Surgical pcr screen     Status: None   Collection Time: 02/24/16  3:26 AM  Result Value Ref Range Status   MRSA, PCR NEGATIVE NEGATIVE Final   Staphylococcus aureus NEGATIVE NEGATIVE Final    Comment:        The Xpert SA Assay (FDA approved for NASAL specimens in patients over 66 years of age), is one component of a comprehensive surveillance program.  Test performance has been validated by [facility] for patients greater than or equal to 20 year old. It is not intended to diagnose infection nor to guide or monitor treatment.   Fungus Culture With Stain (Not @ [facility])     Status: None (Preliminary result)   Collection Time: 02/24/16  8:55 AM  Result Value Ref Range Status   Fungus Stain Final report  Final    Comment: (NOTE) Performed At: [facility] [address] [place], [place] [id number] Mila Homer MD WU:[id number]    Fungus (Mycology) Culture PENDING  Incomplete   Fungal Source FLUID  Final    Comment: PLEURAL RIGHT POF ZOSYN AND VANCOMYCIN   Acid Fast Smear (AFB)     Status: None   Collection Time: 02/24/16  8:55 AM  Result Value Ref Range Status   AFB Specimen  Processing Concentration  Final   Acid Fast Smear Negative  Final    Comment: (NOTE) Performed At: [facility] [address] [place], [place] [id number] Mila Homer MD VH:[id number]    Source (AFB) FLUID  Final    Comment: PLEURAL RIGHT POF ZOSYN AND VANCOMYCIN   Culture, body fluid-bottle     Status: None (Preliminary result)   Collection Time: 02/24/16  8:55 AM  Result Value Ref Range Status   Specimen Description FLUID RIGHT LUNG  Final   Special Requests POF ZOSYN AND VANCOMYCIN  Final   Culture NO GROWTH 3 DAYS  Final   Report Status PENDING  Incomplete  Gram stain     Status: None   Collection Time: 02/24/16  8:55 AM  Result Value Ref Range Status   Specimen Description FLUID RIGHT LUNG  Final   Special Requests POF ZOSYN AND VANCOMYCIN  Final   Gram Stain   Final    MODERATE WBC PRESENT,BOTH PMN AND MONONUCLEAR NO ORGANISMS SEEN    Report Status 02/24/2016 FINAL  Final  Fungus Culture Result     Status: None   Collection Time: 02/24/16  8:55 AM  Result Value Ref Range Status   Result 1 Comment  Final    Comment: (NOTE) KOH/Calcofluor preparation:  no fungus observed. Performed At: [facility] [address] [place], [place] [id number] Mila Homer MD UV:[id number]   Tissue culture  Status: None (Preliminary result)   Collection Time: 02/24/16  9:03 AM  Result Value Ref Range Status   Specimen Description TISSUE RIGHT LUNG  Final   Special Requests PLEURAL PEEL POF ZOSYN AND VANCOMYCIN  Final   Gram Stain   Final    ABUNDANT WBC PRESENT, PREDOMINANTLY PMN NO ORGANISMS SEEN Performed at [facility]    Culture   Final    NO GROWTH 3 DAYS Performed at [facility]    Report Status PENDING  Incomplete  Fungus Culture With Stain (Not @ [facility])     Status: None (Preliminary result)   Collection Time: 02/24/16  9:03 AM  Result Value Ref Range Status   Fungus Stain Final report  Final    Comment: (NOTE) Performed  At: [facility] [address] [place], [place] [id number] Mila HomerHancock William F MD WU:[phone]    Fungus (Mycology) Culture PENDING  Incomplete   Fungal Source TISSUE  Final    Comment: RIGHT LUNG PLEURAL PEEL POF ZOSYN AND VANCOMYCIN   Acid Fast Smear (AFB)     Status: None   Collection Time: 02/24/16  9:03 AM  Result Value Ref Range Status   AFB Specimen Processing Comment  Final    Comment: Tissue Grinding and Digestion/Decontamination   Acid Fast Smear Negative  Final    Comment: (NOTE) Performed At: [facility] [address] [place], [place] [id number] Mila HomerHancock William F MD VH:[phone]    Source (AFB) TISSUE  Final    Comment: RIGHT LUNG PLEURAL PEEL POF ZOSYN AND VANCOMYCIN   Fungus Culture Result     Status: None   Collection Time: 02/24/16  9:03 AM  Result Value Ref Range Status   Result 1 Comment  Final    Comment: (NOTE) KOH/Calcofluor preparation:  no fungus observed. Performed At: [facility] [address] [place], [place] [id number] Mila HomerHancock William F MD UV:[phone]      Studies:   Recent x-ray studies have been reviewed in detail by the Attending Physician  Scheduled Meds:  Scheduled Meds: . acetaminophen  1,000 mg Oral Q6H  . bisacodyl  10 mg Oral Daily  . fentaNYL   Intravenous Q4H  . heparin subcutaneous  5,000 Units Subcutaneous Q8H  . piperacillin-tazobactam (ZOSYN)  IV  3.375 g Intravenous Q8H  . senna-docusate  1 tablet Oral QHS    Time spent on care of this patient: 35 mins   Shelanda Duvall T , MD   [facility] Office  [phone] Pager - Text Page per Loretha StaplerAmion as per below:  On-Call/Text Page:      [email or web address]      password [email or web address]  If 7PM-7AM, please contact night-coverage [email or web address] Password [email or web address] 02/28/2016, 2:30 PM   LOS: 6 days

## 2016-02-29 ENCOUNTER — Inpatient Hospital Stay (HOSPITAL_COMMUNITY): Payer: Self-pay

## 2016-02-29 DIAGNOSIS — J869 Pyothorax without fistula: Secondary | ICD-10-CM | POA: Diagnosis present

## 2016-02-29 DIAGNOSIS — K9189 Other postprocedural complications and disorders of digestive system: Secondary | ICD-10-CM

## 2016-02-29 DIAGNOSIS — K567 Ileus, unspecified: Secondary | ICD-10-CM | POA: Diagnosis present

## 2016-02-29 DIAGNOSIS — J9602 Acute respiratory failure with hypercapnia: Secondary | ICD-10-CM

## 2016-02-29 DIAGNOSIS — J9601 Acute respiratory failure with hypoxia: Secondary | ICD-10-CM | POA: Diagnosis present

## 2016-02-29 DIAGNOSIS — K913 Postprocedural intestinal obstruction: Secondary | ICD-10-CM

## 2016-02-29 DIAGNOSIS — I10 Essential (primary) hypertension: Secondary | ICD-10-CM | POA: Diagnosis present

## 2016-02-29 LAB — CULTURE, BODY FLUID W GRAM STAIN -BOTTLE: Culture: NO GROWTH

## 2016-02-29 LAB — CULTURE, BODY FLUID-BOTTLE

## 2016-02-29 MED ORDER — PHENOL 1.4 % MT LIQD
1.0000 | OROMUCOSAL | Status: DC | PRN
Start: 2016-02-29 — End: 2016-03-02
  Filled 2016-02-29: qty 177

## 2016-02-29 NOTE — Progress Notes (Signed)
Notified Dr. Maudie Flakesrigt regarding inability to remove chest tube on this unit and instructed to have [facility] nurse to remove the tube at bedside. Notified 2 [facility] nurse and awaiting her/his arrival for removal.

## 2016-02-29 NOTE — Progress Notes (Signed)
Nutrition Follow-up  DOCUMENTATION CODES:   Obesity unspecified  INTERVENTION:    Continue Regular diet  NUTRITION DIAGNOSIS:   Inadequate oral intake related to inability to eat as evidenced by NPO status, resolved  GOAL:   Patient will meet greater than or equal to 90% of their needs, met  MONITOR:   PO intake, Labs, Weight trends, I & O's  ASSESSMENT:   40 year old male with undiagnosed OSA, initially presented to ED on 4/15 stating that he had right shoulder pain and had injured it 3 days prior by pulling a rope, was ruled out for any acute issues and was sent home with flexeril and ibuprofen. Returned on 4/17 to [facility] with increased pain to right shoulder stating that it had become harder to take a deep breath. Found to have CAP, possible underlying empyema, and right pleural effusion with consolidation.   Patient s/p procedures 4/21: VIDEO-ASSISTED THORACOSCOPY (VATS) MINI THORACOTOMY   Pt extubated 4/22. TF (Vital HP formula) discontinued via OGT. Advanced to Regular diet 4/23. PO intake 100% per flowsheet records.  Diet Order:  Diet regular Room service appropriate?: Yes; Fluid consistency:: Thin  Skin:  Wound (see comment) (puncture wound to back)  Last BM:  4/23  Height:   Ht Readings from Last 1 Encounters:  02/27/16 6' 1"  (1.854 m)    Weight:   Wt Readings from Last 1 Encounters:  02/27/16 240 lb (108.863 kg)    Ideal Body Weight:  89.1 kg  BMI:  Body mass index is 31.67 kg/(m^2).  Estimated Nutritional Needs:   Kcal:  7209-4709  Protein:  120-140 gm  Fluid:  2.3-2.5 L  EDUCATION NEEDS:   No education needs identified at this time  Arthur Holms, RD, LDN Pager #: [phone] After-Hours Pager #: [phone]

## 2016-02-29 NOTE — Progress Notes (Signed)
[facility] TEAM 1 - Stepdown/ICU TEAM Progress Note  [name] Snow ZOX:[id number] DOB: [birth year] DOA: 02/21/2016 PCP: No primary care provider on file.  Admit HPI / Brief Narrative: 40 year old WM PMHx suspected OSA   Who initially presented to ED on 4/15 stating that he had right shoulder pain after and injury 3 days prior when pulling a rope. He was ruled out for any acute issues and sent home with flexeril and ibuprofen. Returned on 4/17 to [facility] with increased pain to right shoulder stating that it had become harder to take a deep breath. Found to have CAP, possible underlying empyema, and right pleural effusion with consolidation. Started on Rocephin and Zithromax on 4/18, thoracentesis performed on 4/18 without complications while noting that the pleural effusion was loculated and appeared to be exudative. Transferred to [facility] for TCTS evaluation.   HPI/Subjective: 4/25 A/O 4, pain at tolerable level especially since one tube removed today. Negative SOB/CP  Assessment/Plan: Severe sepsis w/ acute respiratory failure with hypoxia and hypercapnia due to PNA w/ Rt empyema  -s/p R VATS 4/20 -extubated 4/22, doing well on 2L [place] -4/25 one chest tube removed -post VATS care per TCTS   OSA  -Cont QHS CPAP   HTN -BP currently well controlled   Post op ileus   -Appears to have resolved - follow closely w/ ongoing narcotic dosing    Code Status: FULL Family Communication: family present at time of exam Disposition Plan: Per Cardiothoracic Surgery   Consultants: Dr.Jennings E Nestor [facility] M Dr.Clarence Zadie Cleverly Cardiothoracic surgery   Procedure/Significant Events: 4/15 ED for right shoulder pain > no acute findings d/c with flexeril and ibuprofen 4/17 ED for increased right shoulder pain, hard to take deep breath bc of pain  4/19 Worsening Respiratory Distress, RR 40s, Large Loculated Rt effusion - transferred to [facility] for VATS  4/20  VATS   Culture NA  Antibiotics: Zithromax 4/18 > 4/19 Rocephin 4/18 > 4/19 Vanc 4/19 > Zosyn 4/19 >   DVT prophylaxis: Subcutaneous Heparin   Devices NA   LINES / TUBES:  NA    Continuous Infusions:   Objective: VITAL SIGNS: Temp: 98.2 F (36.8 C) (04/25 1151) Temp Source: Oral (04/25 1151) BP: 119/70 mmHg (04/25 1151) Pulse Rate: 90 (04/25 1151) SPO2; FIO2:   Intake/Output Summary (Last 24 hours) at 02/29/16 1927 Last data filed at 02/29/16 1700  Gross per 24 hour  Intake    820 ml  Output   1425 ml  Net   -605 ml     Exam: General: A/O 4,No acute respiratory distress Eyes: Negative headache, negative scleral hemorrhage ENT: Negative Runny nose, negative gingival bleeding, Neck:  Negative scars, masses, torticollis, lymphadenopathy, JVD Lungs: Clear to auscultation LUL/lingula/LLL: positive crackles all right lung fields. Right chest tube in place covered and clean  Cardiovascular: Regular rate and rhythm without murmur gallop or rub normal S1 and S2 Abdomen:negative abdominal pain, nondistended, positive soft, bowel sounds, no rebound, no ascites, no appreciable mass Extremities: No significant cyanosis, clubbing, or edema bilateral lower extremities Psychiatric:  Negative depression, negative anxiety, negative fatigue, negative mania  Neurologic:  Cranial nerves II through XII intact, tongue/uvula midline, all extremities muscle strength 5/5, sensation intact throughout, negative dysarthria, negative expressive aphasia, negative receptive aphasia.   Data Reviewed: Basic Metabolic Panel:  Recent Labs Lab 02/24/16 0249  02/25/16 8119 02/25/16 1217 02/25/16 2323 02/26/16 0213 02/27/16 0415 02/28/16 0344  NA 136  --  138  --   --  140 141 141  K 3.9  --  3.9  --   --  4.0 3.4* 5.0  CL 101  --  101  --   --  102 99* 108  CO2 22  --  25  --   --  29 32 24  GLUCOSE 137*  --  137*  --   --  112* 124* 124*  BUN 19  --  17  --   --  17 24* 17   CREATININE 1.20  --  1.03  --   --  0.90 1.01 0.92  CALCIUM 8.3*  --  7.7*  --   --  8.0* 8.2* 7.9*  MG 2.1  < > 2.1 2.2 2.2 2.0 2.1  --   PHOS 3.2  < > 3.8 4.2 3.5 3.6 3.8  --   < > = values in this interval not displayed. Liver Function Tests:  Recent Labs Lab 02/26/16 0213 02/28/16 0344  AST 54* 74*  ALT 42 45  ALKPHOS 44 45  BILITOT 1.8* 1.6*  PROT 4.7* 4.9*  ALBUMIN 1.8* 2.1*   No results for input(s): LIPASE, AMYLASE in the last 168 hours. No results for input(s): AMMONIA in the last 168 hours. CBC:  Recent Labs Lab 02/23/16 0917 02/24/16 0249 02/25/16 0453 02/26/16 0213 02/27/16 0415  WBC 9.5 8.6 7.3 5.8 5.8  NEUTROABS  --  6.8 5.6 4.2 3.8  HGB 12.2* 10.9* 11.6* 10.2* 10.3*  HCT 36.2* 32.7* 35.7* 32.7* 33.0*  MCV 89.4 88.4 89.0 91.6 91.2  PLT 155 159 195 182 231   Cardiac Enzymes: No results for input(s): CKTOTAL, CKMB, CKMBINDEX, TROPONINI in the last 168 hours. BNP (last 3 results)  Recent Labs  02/22/16 0042  BNP 30.0    ProBNP (last 3 results) No results for input(s): PROBNP in the last 8760 hours.  CBG:  Recent Labs Lab 02/26/16 2348 02/27/16 0356 02/27/16 0819 02/27/16 1224 02/27/16 1612  GLUCAP 110* 112* 138* 117* 136*    Recent Results (from the past 240 hour(s))  Culture, blood (Routine X 2) w Reflex to ID Panel     Status: None   Collection Time: 02/22/16  3:00 AM  Result Value Ref Range Status   Specimen Description BLOOD RIGHT ANTECUBITAL  Final   Special Requests   Final    BOTTLES DRAWN AEROBIC AND ANAEROBIC AEB 10CC ANA 6CC   Culture NO GROWTH 6 DAYS  Final   Report Status 02/28/2016 FINAL  Final  MRSA PCR Screening     Status: None   Collection Time: 02/22/16  3:25 AM  Result Value Ref Range Status   MRSA by PCR NEGATIVE NEGATIVE Final    Comment:        The GeneXpert MRSA Assay (FDA approved for NASAL specimens only), is one component of a comprehensive MRSA colonization surveillance program. It is not intended  to diagnose MRSA infection nor to guide or monitor treatment for MRSA infections.   Culture, blood (Routine X 2) w Reflex to ID Panel     Status: None   Collection Time: 02/22/16  3:35 AM  Result Value Ref Range Status   Specimen Description BLOOD LEFT HAND  Final   Special Requests   Final    BOTTLES DRAWN AEROBIC AND ANAEROBIC AEB 12CC ANA 8CC   Culture NO GROWTH 6 DAYS  Final   Report Status 02/28/2016 FINAL  Final  Culture, body fluid-bottle     Status: None   Collection Time: 02/22/16  8:15 AM  Result Value Ref Range Status   Specimen Description LUNG RIGHT  Final   Special Requests NONE 10CC EACH  Final   Culture NO GROWTH 6 DAYS  Final   Report Status 02/28/2016 FINAL  Final  Gram stain     Status: None (Preliminary result)   Collection Time: 02/22/16  8:15 AM  Result Value Ref Range Status   Specimen Description LUNG RIGHT  Final   Special Requests NONE  Final   Gram Stain   Final    NO ORGANISMS SEEN ABUNDANT WBC PRESENT, PREDOMINANTLY PMN    Report Status PENDING  Incomplete  Culture, respiratory (NON-Expectorated)     Status: None   Collection Time: 02/23/16  8:22 PM  Result Value Ref Range Status   Specimen Description TRACHEAL ASPIRATE  Final   Special Requests Normal  Final   Gram Stain   Final    MODERATE WBC PRESENT,BOTH PMN AND MONONUCLEAR NO SQUAMOUS EPITHELIAL CELLS SEEN RARE GRAM POSITIVE COCCI IN PAIRS Performed at [facility]    Culture   Final    NORMAL OROPHARYNGEAL FLORA Performed at [facility]    Report Status 02/28/2016 FINAL  Final  Surgical pcr screen     Status: None   Collection Time: 02/24/16  3:26 AM  Result Value Ref Range Status   MRSA, PCR NEGATIVE NEGATIVE Final   Staphylococcus aureus NEGATIVE NEGATIVE Final    Comment:        The Xpert SA Assay (FDA approved for NASAL specimens in patients over 38 years of age), is one component of a comprehensive surveillance program.  Test performance has been  validated by [facility] for patients greater than or equal to 38 year old. It is not intended to diagnose infection nor to guide or monitor treatment.   Fungus Culture With Stain (Not @ [facility])     Status: None (Preliminary result)   Collection Time: 02/24/16  8:55 AM  Result Value Ref Range Status   Fungus Stain Final report  Final    Comment: (NOTE) Performed At: [facility]  [address] [place], [place] [id number] Mila HomerHancock William F MD WU:[phone]    Fungus (Mycology) Culture PENDING  Incomplete   Fungal Source FLUID  Final    Comment: PLEURAL RIGHT POF ZOSYN AND VANCOMYCIN   Acid Fast Smear (AFB)     Status: None   Collection Time: 02/24/16  8:55 AM  Result Value Ref Range Status   AFB Specimen Processing Concentration  Final   Acid Fast Smear Negative  Final    Comment: (NOTE) Performed At: [facility]  [address] [place], [place] [id number] Mila HomerHancock William F MD VH:[phone]    Source (AFB) FLUID  Final    Comment: PLEURAL RIGHT POF ZOSYN AND VANCOMYCIN   Culture, body fluid-bottle     Status: None   Collection Time: 02/24/16  8:55 AM  Result Value Ref Range Status   Specimen Description FLUID RIGHT LUNG  Final   Special Requests POF ZOSYN AND VANCOMYCIN  Final   Culture NO GROWTH 5 DAYS  Final   Report Status 02/29/2016 FINAL  Final  Gram stain     Status: None   Collection Time: 02/24/16  8:55 AM  Result Value Ref Range Status   Specimen Description FLUID RIGHT LUNG  Final   Special Requests POF ZOSYN AND VANCOMYCIN  Final   Gram Stain   Final    MODERATE WBC PRESENT,BOTH PMN AND MONONUCLEAR NO ORGANISMS  SEEN    Report Status 02/24/2016 FINAL  Final  Fungus Culture Result     Status: None   Collection Time: 02/24/16  8:55 AM  Result Value Ref Range Status   Result 1 Comment  Final    Comment: (NOTE) KOH/Calcofluor preparation:  no fungus observed. Performed At: [facility] [address] [place], [place]  [id number] Mila Homer MD WU:[id number]   Tissue culture     Status: None   Collection Time: 02/24/16  9:03 AM  Result Value Ref Range Status   Specimen Description TISSUE RIGHT LUNG  Final   Special Requests PLEURAL PEEL POF ZOSYN AND VANCOMYCIN  Final   Gram Stain   Final    ABUNDANT WBC PRESENT, PREDOMINANTLY PMN NO ORGANISMS SEEN Performed at [facility]    Culture   Final    NO GROWTH 3 DAYS Performed at [facility]    Report Status 02/28/2016 FINAL  Final  Fungus Culture With Stain (Not @ [facility])     Status: None (Preliminary result)   Collection Time: 02/24/16  9:03 AM  Result Value Ref Range Status   Fungus Stain Final report  Final    Comment: (NOTE) Performed At: [facility] [address] [place], [place] [id number] Mila Homer MD VH:[id number]    Fungus (Mycology) Culture PENDING  Incomplete   Fungal Source TISSUE  Final    Comment: RIGHT LUNG PLEURAL PEEL POF ZOSYN AND VANCOMYCIN   Acid Fast Smear (AFB)     Status: None   Collection Time: 02/24/16  9:03 AM  Result Value Ref Range Status   AFB Specimen Processing Comment  Final    Comment: Tissue Grinding and Digestion/Decontamination   Acid Fast Smear Negative  Final    Comment: (NOTE) Performed At: [facility] [address] [place], [place] [id number] Mila Homer MD UV:[id number]    Source (AFB) TISSUE  Final    Comment: RIGHT LUNG PLEURAL PEEL POF ZOSYN AND VANCOMYCIN   Fungus Culture Result     Status: None   Collection Time: 02/24/16  9:03 AM  Result Value Ref Range Status   Result 1 Comment  Final    Comment: (NOTE) KOH/Calcofluor preparation:  no fungus observed. Performed At: [facility] [address] [place], [place] [id number] Mila Homer MD VF:[id number]      Studies:  Recent x-ray studies have been reviewed in detail by the Attending Physician  Scheduled Meds:  Scheduled Meds: . bisacodyl  10 mg Oral Daily  .  heparin subcutaneous  5,000 Units Subcutaneous Q8H  . piperacillin-tazobactam (ZOSYN)  IV  3.375 g Intravenous Q8H  . senna-docusate  1 tablet Oral BID    Time spent on care of this patient: 40 mins   WOODS, Roselind Messier , MD  [facility] Office  [phone] Pager - [phone]  On-Call/Text Page:      [email or web address]      password [email or web address]  If 7PM-7AM, please contact night-coverage [email or web address] Password [facility] 02/29/2016, 7:27 PM   LOS: 7 days   Care during the described time interval was provided by me .  I have reviewed this patient's available data, including medical history, events of note, physical examination, and all test results as part of my evaluation. I have personally reviewed and interpreted all radiology studies.   Carolyne Littles, MD [phone] Pager

## 2016-02-29 NOTE — Progress Notes (Signed)
Right blue/white chest tube removed by Joni ReiningNicole, RN from [facility]. Placed cover dressing which is clean, dry and intact. Pt tolerated well. Will continue to monitor

## 2016-03-01 ENCOUNTER — Inpatient Hospital Stay (HOSPITAL_COMMUNITY): Payer: Self-pay

## 2016-03-01 LAB — COMPREHENSIVE METABOLIC PANEL
ALT: 46 U/L (ref 17–63)
AST: 31 U/L (ref 15–41)
Albumin: 2.3 g/dL — ABNORMAL LOW (ref 3.5–5.0)
Alkaline Phosphatase: 51 U/L (ref 38–126)
Anion gap: 10 (ref 5–15)
BUN: 17 mg/dL (ref 6–20)
CALCIUM: 8.1 mg/dL — AB (ref 8.9–10.3)
CO2: 22 mmol/L (ref 22–32)
CREATININE: 0.88 mg/dL (ref 0.61–1.24)
Chloride: 103 mmol/L (ref 101–111)
Glucose, Bld: 110 mg/dL — ABNORMAL HIGH (ref 65–99)
Potassium: 4.2 mmol/L (ref 3.5–5.1)
Sodium: 135 mmol/L (ref 135–145)
Total Bilirubin: 0.9 mg/dL (ref 0.3–1.2)
Total Protein: 5.5 g/dL — ABNORMAL LOW (ref 6.5–8.1)

## 2016-03-01 LAB — CBC WITH DIFFERENTIAL/PLATELET
BASOS ABS: 0 10*3/uL (ref 0.0–0.1)
BASOS PCT: 0 %
EOS ABS: 0.2 10*3/uL (ref 0.0–0.7)
Eosinophils Relative: 3 %
HCT: 33.6 % — ABNORMAL LOW (ref 39.0–52.0)
HEMOGLOBIN: 11.2 g/dL — AB (ref 13.0–17.0)
Lymphocytes Relative: 22 %
Lymphs Abs: 1.9 10*3/uL (ref 0.7–4.0)
MCH: 29.3 pg (ref 26.0–34.0)
MCHC: 33.3 g/dL (ref 30.0–36.0)
MCV: 88 fL (ref 78.0–100.0)
MONOS PCT: 7 %
Monocytes Absolute: 0.6 10*3/uL (ref 0.1–1.0)
NEUTROS PCT: 68 %
Neutro Abs: 5.7 10*3/uL (ref 1.7–7.7)
Platelets: 288 10*3/uL (ref 150–400)
RBC: 3.82 MIL/uL — AB (ref 4.22–5.81)
RDW: 14.9 % (ref 11.5–15.5)
WBC: 8.4 10*3/uL (ref 4.0–10.5)

## 2016-03-01 LAB — GRAM STAIN: Gram Stain: NONE SEEN

## 2016-03-01 LAB — MAGNESIUM: Magnesium: 2 mg/dL (ref 1.7–2.4)

## 2016-03-01 MED ORDER — AMOXICILLIN-POT CLAVULANATE 875-125 MG PO TABS
1.0000 | ORAL_TABLET | Freq: Two times a day (BID) | ORAL | Status: DC
  Administered 2016-03-01 – 2016-03-02 (×3): 1 via ORAL
  Filled 2016-03-01 (×4): qty 1

## 2016-03-01 NOTE — Progress Notes (Signed)
Contacted unit 3 [facility], spoke with charge nurse Whitney about discontinuing pt's chest tube for pt (staff on [facility] are chest tube removal competent). Staff on [facility] not able to remove chest tubes. [name] LemmingsWhitney will come and remove pt's chest tube, she will call back as soon as she is able to come up to unit, anticipate this afternoon. Pt also informed of plan.

## 2016-03-01 NOTE — Progress Notes (Signed)
      [address]       [place] [id number]             [phone]      6 Days Post-Op Procedure(s) (LRB): VIDEO ASSISTED THORACOSCOPY Right (VATS) for [facility]/EMPYEMA, decortication of lung (Right)   Subjective:  Mr. Jone Basemanurdy has no new complaints.    Objective: Vital signs in last 24 hours: Temp:  [97.2 F (36.2 C)-98.4 F (36.9 C)] 97.2 F (36.2 C) (04/26 0740) Pulse Rate:  [71-108] 85 (04/26 0740) Cardiac Rhythm:  [-] Normal sinus rhythm (04/26 0341) Resp:  [19-34] 31 (04/26 0740) BP: (103-128)/(59-94) 123/64 mmHg (04/26 0740) SpO2:  [92 %-100 %] 93 % (04/26 0740)  Intake/Output from previous day: 04/25 0701 - 04/26 0700 In: 1110 [P.O.:960; IV Piggyback:150] Out: 1460 [Urine:1400; Chest Tube:60]  General appearance: alert, cooperative and no distress Heart: regular rate and rhythm Lungs: diminished breath sounds on right Abdomen: soft, non-tender; bowel sounds normal; no masses,  no organomegaly Wound: clean and dry  Lab Results:  Recent Labs  03/01/16 0533  WBC 8.4  HGB 11.2*  HCT 33.6*  PLT 288   BMET:  Recent Labs  02/28/16 0344 03/01/16 0533  NA 141 135  K 5.0 4.2  CL 108 103  CO2 24 22  GLUCOSE 124* 110*  BUN 17 17  CREATININE 0.92 0.88  CALCIUM 7.9* 8.1*    PT/INR: No results for input(s): LABPROT, INR in the last 72 hours. ABG    Component Value Date/Time   PHART 7.431 02/25/2016 0330   HCO3 27.5* 02/25/2016 0330   TCO2 28.8 02/25/2016 0330   ACIDBASEDEF 2.0 02/22/2016 0235   O2SAT 98.3 02/25/2016 0330   CBG (last 3)   Recent Labs  02/27/16 0819 02/27/16 1224 02/27/16 1612  GLUCAP 138* 117* 136*    Assessment/Plan: S/P Procedure(s) (LRB): VIDEO ASSISTED THORACOSCOPY Right (VATS) for /EMPYEMA, decortication of lung (Right)  1. Chest tube- no air leak, 60 cc output yesterday- possibly remove final chest tube today 2. Pulm- off oxygen, no acute issues CXR is stable, no pneumothorax, post surgical scarring remains  present 3. ID- Empyema, OR cultures remain negative, remains on zosyn.Marland Kitchen.Marland Kitchen.Will transition to oral Augmentin and recommend 14 days of therapy at discharge 4. Dipso- patient stable, will remove final chest tube today, oral ABX, repeat CXR in AM   LOS: 8 days    Dashiell Franchino 03/01/2016

## 2016-03-01 NOTE — Research (Cosign Needed)
Protocol Title: The MIND-USA Study Modifying the Impact of ICU-Associated Neurological Dysfunction: A multicenter, doubleblind, randomized, placebo-controlled trial investigating the effects of intravenously (IV) administered typical and atypical antipsychotics (haloperidol and ziprasidone) on delirium in critically ill patients.   MIND-USA Study Informed Consent   Subject Name: [name] Snow  This patient, [name] Snow, has been re-consented to the above clinical trial according to FDA regulations, GCP guidelines and [facility]'s SOPs. Mr. [name] Snow's condition has now improved as his mental status and emotional capacity are adequate to re-consent to this study.The informed consent form and study design have been explained to this patient by this study coordinator. The patient demonstrated comprehension of this clinical trial and study requirements/expectations. The patient was given sufficient time for reading the consent form. All risks, benefits and options have been thoroughly discussed and all questions were answered per the patient's satisfaction. This patient was not coerced in any way to participate in this clinical trial. This patient has voluntarily signed consent version 5.2 at 08:00 AM on 03/01/2016. A copy of the signed consent form was given to the patient and a copy was placed in the subject's medical record.  Toula MoosFrances J Ren Grasse, RN Clinical Research Nurse Coordinator [facility] Office: [phone]

## 2016-03-01 NOTE — Progress Notes (Signed)
Offered Pt a bath. Pt and significant other both stated that tech assistance was not needed. Tech gave Pt bath supplies.

## 2016-03-01 NOTE — Progress Notes (Signed)
[place] TEAM 1 - Stepdown/ICU TEAM PROGRESS NOTE  [name] Snow ION:[id number] DOB: August 04, 1976 DOA: 02/21/2016 PCP: No primary care provider on file.  Admit HPI / Brief Narrative: 40 year old male with suspected OSA who initially presented to ED on 4/15 stating that he had right shoulder pain after and injury 3 days prior when pulling a rope.  He was ruled out for any acute issues and sent home with flexeril and ibuprofen. Returned on 4/17 to [facility] with increased pain to right shoulder stating that it had become harder to take a deep breath. Found to have CAP, possible underlying empyema, and right pleural effusion with consolidation. Started on Rocephin and Zithromax on 4/18, thoracentesis performed on 4/18 without complications while noting that the pleural effusion was loculated and appeared to be exudative. Transferred to [facility] for TCTS evaluation.   Significant Events: 4/15 ED for right shoulder pain > no acute findings d/c with flexeril and ibuprofen 4/17  ED for increased right shoulder pain, hard to take deep breath bc of pain  4/19  Worsening Respiratory Distress, RR 40s, Large Loculated Rt effusion - transferred to [facility] for VATS  4/20 VATS  HPI/Subjective: Doing well. No new complaints.  Pain at site of chest tube persists, but is tolerable.  His wife is at the bedside, and she is quite concerned about sleep apnea.  The pt himself has been refusing nightly CPAP while in the hospital.  He has never had a formal sleep study.    Assessment/Plan:  Severe sepsis w/ Acute Hypoxic/Hypercarbic Respiratory Failure due to PNA w/ R empyema  -s/p R VATS 4/20 -extubated 4/22, doing well on 2L [place] -post VATS care per TCTS - last chest tube removed today  -has completed a full course of abx   OSA  Cont QHS CPAP   HTN BP currently well controlled   Post op ileus   Appears to have resolved - follow closely w/ ongoing narcotic dosing   Possible sleep apnea Pt is not  interested in CPAP, but his mother is - observe saturations over night and determine if home CPAP likely indicated - suggest outpt sleep study once recovered fully   Code Status: FULL Family Communication: spoke w/ mother at bedside  Disposition Plan: probable d/c home in AM   Consultants: PCCM  TCTS  Antibiotics: Zithromax 4/18 > 4/19 Rocephin 4/18 > 4/19 Vanc 4/19 > Zosyn 4/19 >  DVT prophylaxis: SQ heparin   Objective: Blood pressure 126/65, pulse 89, temperature 97.4 F (36.3 C), temperature source Oral, resp. rate 18, height 6\' 1"  (1.854 m), weight 108.863 kg (240 lb), SpO2 95 %.  Intake/Output Summary (Last 24 hours) at 03/01/16 1739 Last data filed at 03/01/16 0956  Gross per 24 hour  Intake    820 ml  Output   1160 ml  Net   -340 ml    Exam: General: No acute respiratory distress in bed  Lungs: No wheezes or crackles  Cardiovascular: Regular rate and rhythm without murmur gallop or rub  Abdomen: Nontender, nondistended, soft, bowel sounds positive Extremities: No significant cyanosis, clubbing, edema bilateral lower extremities  Data Reviewed: Basic Metabolic Panel:  Recent Labs Lab 02/25/16 0453 02/25/16 1217 02/25/16 2323 02/26/16 0213 02/27/16 0415 02/28/16 0344 03/01/16 0533  NA 138  --   --  140 141 141 135  K 3.9  --   --  4.0 3.4* 5.0 4.2  CL 101  --   --  102 99* 108 103  CO2 25  --   --  29 32 24 22  GLUCOSE 137*  --   --  112* 124* 124* 110*  BUN 17  --   --  17 24* 17 17  CREATININE 1.03  --   --  0.90 1.01 0.92 0.88  CALCIUM 7.7*  --   --  8.0* 8.2* 7.9* 8.1*  MG 2.1 2.2 2.2 2.0 2.1  --  2.0  PHOS 3.8 4.2 3.5 3.6 3.8  --   --     CBC:  Recent Labs Lab 02/24/16 0249 02/25/16 0453 02/26/16 0213 02/27/16 0415 03/01/16 0533  WBC 8.6 7.3 5.8 5.8 8.4  NEUTROABS 6.8 5.6 4.2 3.8 5.7  HGB 10.9* 11.6* 10.2* 10.3* 11.2*  HCT 32.7* 35.7* 32.7* 33.0* 33.6*  MCV 88.4 89.0 91.6 91.2 88.0  PLT 159 195 182 231 288    Liver Function  Tests:  Recent Labs Lab 02/26/16 0213 02/28/16 0344 03/01/16 0533  AST 54* 74* 31  ALT 42 45 46  ALKPHOS 44 45 51  BILITOT 1.8* 1.6* 0.9  PROT 4.7* 4.9* 5.5*  ALBUMIN 1.8* 2.1* 2.3*   Coags:  Recent Labs Lab 02/24/16 0249  INR 1.33    Recent Labs Lab 02/24/16 0249  APTT 32   CBG:  Recent Labs Lab 02/26/16 2348 02/27/16 0356 02/27/16 0819 02/27/16 1224 02/27/16 1612  GLUCAP 110* 112* 138* 117* 136*    Recent Results (from the past 240 hour(s))  Culture, blood (Routine X 2) w Reflex to ID Panel     Status: None   Collection Time: 02/22/16  3:00 AM  Result Value Ref Range Status   Specimen Description BLOOD RIGHT ANTECUBITAL  Final   Special Requests   Final    BOTTLES DRAWN AEROBIC AND ANAEROBIC AEB 10CC ANA 6CC   Culture NO GROWTH 6 DAYS  Final   Report Status 02/28/2016 FINAL  Final  MRSA PCR Screening     Status: None   Collection Time: 02/22/16  3:25 AM  Result Value Ref Range Status   MRSA by PCR NEGATIVE NEGATIVE Final    Comment:        The GeneXpert MRSA Assay (FDA approved for NASAL specimens only), is one component of a comprehensive MRSA colonization surveillance program. It is not intended to diagnose MRSA infection nor to guide or monitor treatment for MRSA infections.   Culture, blood (Routine X 2) w Reflex to ID Panel     Status: None   Collection Time: 02/22/16  3:35 AM  Result Value Ref Range Status   Specimen Description BLOOD LEFT HAND  Final   Special Requests   Final    BOTTLES DRAWN AEROBIC AND ANAEROBIC AEB 12CC ANA 8CC   Culture NO GROWTH 6 DAYS  Final   Report Status 02/28/2016 FINAL  Final  Culture, body fluid-bottle     Status: None   Collection Time: 02/22/16  8:15 AM  Result Value Ref Range Status   Specimen Description LUNG RIGHT  Final   Special Requests NONE 10CC EACH  Final   Culture NO GROWTH 6 DAYS  Final   Report Status 02/28/2016 FINAL  Final  Gram stain     Status: None   Collection Time: 02/22/16  8:15  AM  Result Value Ref Range Status   Specimen Description FLUID RIGHT PLEURAL COLLECTED BY DOCTOR  Final   Special Requests NONE  Final   Gram Stain   Final    NO ORGANISMS SEEN ABUNDANT  WBC PRESENT, PREDOMINANTLY PMN Performed at [facility]    Report Status 03/01/2016 FINAL  Final  Culture, respiratory (NON-Expectorated)     Status: None   Collection Time: 02/23/16  8:22 PM  Result Value Ref Range Status   Specimen Description TRACHEAL ASPIRATE  Final   Special Requests Normal  Final   Gram Stain   Final    MODERATE WBC PRESENT,BOTH PMN AND MONONUCLEAR NO SQUAMOUS EPITHELIAL CELLS SEEN RARE GRAM POSITIVE COCCI IN PAIRS Performed at [facility]    Culture   Final    NORMAL OROPHARYNGEAL FLORA Performed at [facility]    Report Status 02/28/2016 FINAL  Final  Surgical pcr screen     Status: None   Collection Time: 02/24/16  3:26 AM  Result Value Ref Range Status   MRSA, PCR NEGATIVE NEGATIVE Final   Staphylococcus aureus NEGATIVE NEGATIVE Final    Comment:        The Xpert SA Assay (FDA approved for NASAL specimens in patients over 61 years of age), is one component of a comprehensive surveillance program.  Test performance has been validated by [facility] for patients greater than or equal to 63 year old. It is not intended to diagnose infection nor to guide or monitor treatment.   Fungus Culture With Stain (Not @ [facility])     Status: None (Preliminary result)   Collection Time: 02/24/16  8:55 AM  Result Value Ref Range Status   Fungus Stain Final report  Final    Comment: (NOTE) Performed At: [facility] [address] [place], [place] [id number] Mila Homer MD NF:[id number]    Fungus (Mycology) Culture PENDING  Incomplete   Fungal Source FLUID  Final    Comment: PLEURAL RIGHT POF ZOSYN AND VANCOMYCIN   Acid Fast Smear (AFB)     Status: None   Collection Time: 02/24/16  8:55 AM  Result Value Ref Range Status   AFB  Specimen Processing Concentration  Final   Acid Fast Smear Negative  Final    Comment: (NOTE) Performed At: [facility] [address] [place], [place] [id number] Mila Homer MD UX:[id number]    Source (AFB) FLUID  Final    Comment: PLEURAL RIGHT POF ZOSYN AND VANCOMYCIN   Culture, body fluid-bottle     Status: None   Collection Time: 02/24/16  8:55 AM  Result Value Ref Range Status   Specimen Description FLUID RIGHT LUNG  Final   Special Requests POF ZOSYN AND VANCOMYCIN  Final   Culture NO GROWTH 5 DAYS  Final   Report Status 02/29/2016 FINAL  Final  Gram stain     Status: None   Collection Time: 02/24/16  8:55 AM  Result Value Ref Range Status   Specimen Description FLUID RIGHT LUNG  Final   Special Requests POF ZOSYN AND VANCOMYCIN  Final   Gram Stain   Final    MODERATE WBC PRESENT,BOTH PMN AND MONONUCLEAR NO ORGANISMS SEEN    Report Status 02/24/2016 FINAL  Final  Fungus Culture Result     Status: None   Collection Time: 02/24/16  8:55 AM  Result Value Ref Range Status   Result 1 Comment  Final    Comment: (NOTE) KOH/Calcofluor preparation:  no fungus observed. Performed At: [facility] [address] [place], [place] [id number] Mila Homer MD VQ:[id number]   Tissue culture     Status: None   Collection Time: 02/24/16  9:03 AM  Result Value Ref  Range Status   Specimen Description TISSUE RIGHT LUNG  Final   Special Requests PLEURAL PEEL POF ZOSYN AND VANCOMYCIN  Final   Gram Stain   Final    ABUNDANT WBC PRESENT, PREDOMINANTLY PMN NO ORGANISMS SEEN Performed at [facility]    Culture   Final    NO GROWTH 3 DAYS Performed at [facility]    Report Status 02/28/2016 FINAL  Final  Fungus Culture With Stain (Not @ [facility])     Status: None (Preliminary result)   Collection Time: 02/24/16  9:03 AM  Result Value Ref Range Status   Fungus Stain Final report  Final    Comment: (NOTE) Performed At: [facility] [address] [place], [place] [id number] Mila Homer MD WU:[id number]    Fungus (Mycology) Culture PENDING  Incomplete   Fungal Source TISSUE  Final    Comment: RIGHT LUNG PLEURAL PEEL POF ZOSYN AND VANCOMYCIN   Acid Fast Smear (AFB)     Status: None   Collection Time: 02/24/16  9:03 AM  Result Value Ref Range Status   AFB Specimen Processing Comment  Final    Comment: Tissue Grinding and Digestion/Decontamination   Acid Fast Smear Negative  Final    Comment: (NOTE) Performed At: [facility] [address] [place], [place] [id number] Mila Homer MD VH:[id number]    Source (AFB) TISSUE  Final    Comment: RIGHT LUNG PLEURAL PEEL POF ZOSYN AND VANCOMYCIN   Fungus Culture Result     Status: None   Collection Time: 02/24/16  9:03 AM  Result Value Ref Range Status   Result 1 Comment  Final    Comment: (NOTE) KOH/Calcofluor preparation:  no fungus observed. Performed At: [facility] [address] [place], [place] [id number] Mila Homer MD UV:[id number]      Studies:   Recent x-ray studies have been reviewed in detail by the Attending Physician  Scheduled Meds:  Scheduled Meds: . amoxicillin-clavulanate  1 tablet Oral Q12H  . bisacodyl  10 mg Oral Daily  . heparin subcutaneous  5,000 Units Subcutaneous Q8H  . senna-docusate  1 tablet Oral BID    Time spent on care of this patient: 25 mins   [facility] T , MD   [facility] Office  [phone] Pager - Text Page per Loretha Stapler as per below:  On-Call/Text Page:      [email or web address]      password [email or web address]  If 7PM-7AM, please contact night-coverage [email or web address] Password [email or web address] 03/01/2016, 5:39 PM   LOS: 8 days

## 2016-03-01 NOTE — Progress Notes (Signed)
Pt refuse NIV for the night, pt states that he does not want to use it. NIV machine removed from room. Pt is stable at this time 97% RA

## 2016-03-01 NOTE — Progress Notes (Signed)
Called by patients primary nurse Tammy to request assistance removing chest tube. Discontinued last remaining chest tube per MD order, pt tolerated procedure well. Education regarding removal given and answered all questions. Primary nurse Tammy was notified.

## 2016-03-02 ENCOUNTER — Inpatient Hospital Stay (HOSPITAL_COMMUNITY): Payer: MEDICAID

## 2016-03-02 DIAGNOSIS — J90 Pleural effusion, not elsewhere classified: Secondary | ICD-10-CM | POA: Diagnosis present

## 2016-03-02 DIAGNOSIS — Z9889 Other specified postprocedural states: Secondary | ICD-10-CM | POA: Diagnosis present

## 2016-03-02 LAB — COMPREHENSIVE METABOLIC PANEL
ALT: 42 U/L (ref 17–63)
ANION GAP: 8 (ref 5–15)
AST: 25 U/L (ref 15–41)
Albumin: 2.3 g/dL — ABNORMAL LOW (ref 3.5–5.0)
Alkaline Phosphatase: 55 U/L (ref 38–126)
BUN: 15 mg/dL (ref 6–20)
CHLORIDE: 104 mmol/L (ref 101–111)
CO2: 27 mmol/L (ref 22–32)
Calcium: 8.2 mg/dL — ABNORMAL LOW (ref 8.9–10.3)
Creatinine, Ser: 0.95 mg/dL (ref 0.61–1.24)
GFR calc non Af Amer: >60 mL/min (ref >60)
Glucose, Bld: 115 mg/dL — ABNORMAL HIGH (ref 65–99)
Potassium: 4.2 mmol/L (ref 3.5–5.1)
SODIUM: 139 mmol/L (ref 135–145)
Total Bilirubin: 0.7 mg/dL (ref 0.3–1.2)
Total Protein: 5.7 g/dL — ABNORMAL LOW (ref 6.5–8.1)

## 2016-03-02 LAB — CBC WITH DIFFERENTIAL/PLATELET
BASOS ABS: 0 10*3/uL (ref 0.0–0.1)
Basophils Relative: 0 %
Eosinophils Absolute: 0.3 10*3/uL (ref 0.0–0.7)
Eosinophils Relative: 3 %
HEMATOCRIT: 32.6 % — AB (ref 39.0–52.0)
Hemoglobin: 10.4 g/dL — ABNORMAL LOW (ref 13.0–17.0)
LYMPHS ABS: 1.3 10*3/uL (ref 0.7–4.0)
LYMPHS PCT: 18 %
MCH: 28.1 pg (ref 26.0–34.0)
MCHC: 31.9 g/dL (ref 30.0–36.0)
MCV: 88.1 fL (ref 78.0–100.0)
MONO ABS: 0.5 10*3/uL (ref 0.1–1.0)
Monocytes Relative: 7 %
NEUTROS ABS: 5.2 10*3/uL (ref 1.7–7.7)
NEUTROS PCT: 71 %
Platelets: 269 10*3/uL (ref 150–400)
RBC: 3.7 MIL/uL — AB (ref 4.22–5.81)
RDW: 14.9 % (ref 11.5–15.5)
WBC: 7.3 10*3/uL (ref 4.0–10.5)

## 2016-03-02 LAB — MAGNESIUM: Magnesium: 2.1 mg/dL (ref 1.7–2.4)

## 2016-03-02 MED ORDER — AMOXICILLIN-POT CLAVULANATE 875-125 MG PO TABS: 1.0000 | ORAL_TABLET | Freq: Two times a day (BID) | ORAL | Status: DC

## 2016-03-02 MED ORDER — OXYCODONE HCL 5 MG PO TABS: 5.0000 mg | ORAL_TABLET | ORAL | Status: DC | PRN

## 2016-03-02 NOTE — Progress Notes (Signed)
Patient noted to have O2 sat of 77%on room air when sleeping.

## 2016-03-02 NOTE — Care Management Note (Signed)
Case Management Note  Patient Details  Name: [name] SplinterJames Stanford Prouse MRN: [id number] Date of Birth: Oct 28, 1976  Subjective/Objective:     Pt will need home O2 @ night, has no insurance.  [facility] will meet with pt to determine if he qualifies for charity care.              Expected Discharge Date:  02/25/16               Expected Discharge Plan:  Home/Self Care  Discharge planning Services  CM Consult  Post Acute Care Choice:  Durable Medical Equipment  DME Arranged:  Oxygen DME Agency:  [facility]  Status of Service:  Completed, signed off  Magdalene RiverMayo, Orlando Thalmann T, CaliforniaRN 03/02/2016, 1:53 PM

## 2016-03-02 NOTE — Discharge Summary (Addendum)
Physician Discharge Summary  [name] Snow ZOX:[id number] DOB: [birth year] DOA: 02/21/2016  PCP: No primary care provider on file.  Admit date: 02/21/2016 Discharge date: 03/02/2016  Time spent: 35 minutes  Recommendations for Outpatient Follow-up:  Severe sepsis w/ acute respiratory failure with hypoxia and hypercapnia due to PNA w/ Rt empyema  -s/p R VATS 4/20 -extubated 4/22, doing well on 2L [place] -4/25 one chest tube removed -Follow-up with Dr. Kathlee Nations [facility] in 2 weeks. His office will contact patient with date and time. -Establish care in 1-2 weeks with [facility]  OSA  -Patient were require spirometry pre-/post bronchodilator with DLCO and then a sleep study.  -In the Short-term will discharge patient on nighttime O2, 2 L via [place]  HTN -BP currently well controlled   Post op ileus   -Resolved.       Discharge Diagnoses:  Principal Problem:   CAP (community acquired pneumonia) Active Problems:   ARF (acute respiratory failure) (HCC)   SOB (shortness of breath)   Chest pain   Pleural effusion on right   Empyema lung (HCC)   Endotracheally intubated   Hypoxemia   Respiratory failure (HCC)   Empyema (HCC)   Acute respiratory failure with hypoxia and hypercapnia (HCC)   Ileus, postoperative   Essential hypertension   Pleural effusion, right   Status post thoracentesis   Discharge Condition: Stable  Diet recommendation: Regular  Filed Weights   02/25/16 0500 02/26/16 0339 02/27/16 1808  Weight: 105.8 kg (233 lb 4 oz) 105.3 kg (232 lb 2.3 oz) 108.863 kg (240 lb)    History of present illness:  40 year old WM PMHx suspected OSA   Who initially presented to ED on 4/15 stating that he had right shoulder pain after and injury 3 days prior when pulling a rope. He was ruled out for any acute issues and sent home with flexeril and ibuprofen. Returned on 4/17 to [facility] with increased pain to  right shoulder stating that it had become harder to take a deep breath. Found to have CAP, possible underlying empyema, and right pleural effusion with consolidation. Started on Rocephin and Zithromax on 4/18, thoracentesis performed on 4/18 without complications while noting that the pleural effusion was loculated and appeared to be exudative. Transferred to [facility] for TCTS evaluation.  During his hospitalization patient was treated for pneumonia and a right empyema. Patient S/P VATS on 4/20 requiring 2 chest tubes. Both chest tubes have been removed and patient does well during the daytime on room air, unfortunately at night patient is dropping his SPO2 into the low 80s. This is most likely secondary to OSA however has never received an official sleep study. Patient will require home O2 at night until he can receive official sleep study. Patient's IV antibiotics have been discontinued and will be discharged on Augmentin per [facility]. Patient is to follow-up with [facility] in 2 weeks.    Consultants: Dr.Jennings E Nestor [facility] M Dr.Clarence Zadie [facility]ver[facility] Cardiothoracic surgery   Procedure/Significant Events: 4/15 ED for right shoulder pain > no acute findings d/c with flexeril and ibuprofen 4/17 ED for increased right shoulder pain, hard to take deep breath bc of pain  4/19 Worsening Respiratory Distress, RR 40s, Large Loculated Rt effusion - transferred to [facility] for VATS  4/20 VATS   Culture NA  Antibiotics: Zithromax 4/18 > 4/19 Rocephin 4/18 > 4/19 Vanc 4/19 > 4/23 Zosyn 4/19 > 4/26 Augmentin 4/26>>  Discharge Exam: Filed Vitals:   03/02/16 0309 03/02/16 0310 03/02/16 0727 03/02/16 1209  BP: 130/72  115/63 126/65  Pulse: 92 73 86 85  Temp: 98.7 F (37.1 C)  98.3 F (36.8 C) 97.9 F (36.6 C)  TempSrc: Oral  Oral Oral  Resp: 27 25 20 28   Height:      Weight:      SpO2: 93% 95% 95% 94%    General: A/O 4,No acute respiratory distress Eyes: Negative headache, negative scleral  hemorrhage ENT: Negative Runny nose, negative gingival bleeding, Neck: Negative scars, masses, torticollis, lymphadenopathy, JVD Lungs: Clear to auscultation bilateral. Chest tube entrance incision covered and clean.  Cardiovascular: Regular rate and rhythm without murmur gallop or rub normal S1 and S2 Abdomen:negative abdominal pain, nondistended, positive soft, bowel sounds, no rebound, no ascites, no appreciable mass   Discharge Instructions     Medication List    TAKE these medications        amoxicillin-clavulanate 875-125 MG tablet  Commonly known as:  AUGMENTIN  Take 1 tablet by mouth every 12 (twelve) hours.     cyclobenzaprine 10 MG tablet  Commonly known as:  FLEXERIL  Take 1 tablet (10 mg total) by mouth 2 (two) times daily as needed for muscle spasms.     ibuprofen 800 MG tablet  Commonly known as:  ADVIL,MOTRIN  Take 1 tablet (800 mg total) by mouth every 8 (eight) hours as needed for moderate pain.     oxyCODONE 5 MG immediate release tablet  Commonly known as:  Oxy IR/ROXICODONE  Take 1-2 tablets (5-10 mg total) by mouth every 3 (three) hours as needed for moderate pain.     oxyCODONE-acetaminophen 5-325 MG tablet  Commonly known as:  PERCOCET/ROXICET  Take 2 tablets by mouth every 4 (four) hours as needed for severe pain.       No Known Allergies Follow-up Information    Follow up with Kerin Perna III, MD In 2 weeks.   Specialty:  Cardiothoracic Surgery   Why:  Office will contact you with appointment... Please get CXR at [facility] imaging 30 min prior to your appointment located on the first floor of [facility] information:   [address] [place] [address] [place] [address] [phone]       Follow up with [facility]. Schedule an appointment as soon as possible for a visit in 2 weeks.   Why:  Establish care in 1-2 weeks with [facility]   Contact  information:   [address] [place] [address] [phone]       The results of significant diagnostics from this hospitalization (including imaging, microbiology, ancillary and laboratory) are listed below for reference.    Significant Diagnostic Studies: Dg Chest 1 View  02/22/2016  CLINICAL DATA:  Post thoracentesis EXAM: CHEST 1 VIEW COMPARISON:  Earlier today FINDINGS: Persistent large right pleural effusion obscuring the lower lungs. Streaky retrocardiac opacity, mild atelectasis based on CT. No cardiomegaly when accounting for mediastinal fat pad. No pneumothorax. IMPRESSION: No acute finding after thoracentesis. Large and loculated right pleural effusion suspicious for empyema. Electronically Signed   By: Marnee Spring M.D.   On: 02/22/2016 11:02   Dg Chest 2 View  03/02/2016  CLINICAL DATA:  Chest tube removal.  Short of breath. EXAM: CHEST  2 VIEW COMPARISON:  Yesterday FINDINGS: Right chest tube removed. No ensuing large pneumothorax. Tiny loculated anterior  pneumothorax is stable. Pleural and parenchymal changes throughout the right hemi thorax are stable. Left lung remains clear. Normal heart size. IMPRESSION: Right chest tube removal without ensuing large pneumothorax. Tiny loculated anterior pneumothorax is stable. Pleural and parenchymal changes throughout the right hemi thorax are stable. Electronically Signed   By: Jolaine Click M.D.   On: 03/02/2016 07:56   Dg Chest 2 View  03/01/2016  CLINICAL DATA:  Shortness of breath today. Recent chest tube placement for empyema. EXAM: CHEST  2 VIEW COMPARISON:  02/29/2016 and 02/28/2016. FINDINGS: Right chest tube appears unchanged in position. The loculated right pleural effusion also appears unchanged. Today's lateral view demonstrates a small amount of pleural air anteriorly and a small amount of loculated pleural fluid posteriorly. The underlying right lung opacities are stable. The left lung is clear. The  heart size and mediastinal contours are stable. IMPRESSION: No significant change in loculated right-sided hydro pneumothorax and underlying right pulmonary opacity. Electronically Signed   By: Carey Bullocks M.D.   On: 03/01/2016 08:10   Dg Ribs Unilateral W/chest Right  02/22/2016  CLINICAL DATA:  Shortness of breath. Increasing right chest and rib pain. Diminished breath sounds on the right. EXAM: RIGHT RIBS AND CHEST - 3+ VIEW COMPARISON:  None. FINDINGS: Complete opacification of the lower 1/2-2/3 of right hemithorax. This likely represents a combination of pleural fluid atelectasis or consolidation. Questionable trace left pleural effusion. Heart is normal in size, partially obscured by right hemithorax opacity. No pulmonary edema. No pneumothorax. No fracture or acute bony abnormality of the right ribs. IMPRESSION: 1. Opacification of the lower 1/2 of right hemithorax, likely combination of pleural fluid and atelectasis or consolidation. No pneumothorax or acute rib fracture is seen. 2. Question small left pleural effusion. Electronically Signed   By: Rubye Oaks M.D.   On: 02/22/2016 00:33   Dg Shoulder Right  02/19/2016  CLINICAL DATA:  Right shoulder pain since trying to pull a rope 3 weeks ago. Initial encounter. EXAM: RIGHT SHOULDER - 2+ VIEW COMPARISON:  None. FINDINGS: There is no evidence of fracture or dislocation. There is no evidence of arthropathy or other focal bone abnormality. Soft tissues are unremarkable. IMPRESSION: Negative. Electronically Signed   By: Marnee Spring M.D.   On: 02/19/2016 20:39   Ct Chest Wo Contrast  02/22/2016  CLINICAL DATA:  Right shoulder pain radiating to the right upper back for 1 week. Patient injured the right shoulder while pulling on patellar. Cough today. Shortness of breath. Follow-up of abnormal chest x-ray. EXAM: CT CHEST WITHOUT CONTRAST TECHNIQUE: Multidetector CT imaging of the chest was performed following the standard protocol without IV  contrast. COMPARISON:  Chest radiograph 02/22/2016 FINDINGS: Normal heart size. Mild pericardial thickening. Normal caliber thoracic aorta. No significant lymphadenopathy in the chest. Esophagus is mostly decompressed. Evaluation of lungs is limited due to respiratory motion artifact. There is a moderate-sized right pleural effusion with consolidation in the right lung base. Fluid in the fissures. Changes are nonspecific but could indicate pneumonia. Emphysematous changes in the lungs. Airways appear patent. Included portions of the upper abdominal organs demonstrate no gross abnormality in the unenhanced imaging. Degenerative changes in the spine. No destructive bone lesions. Ribs appear intact. IMPRESSION: Moderate size right pleural effusion with basilar consolidation. Appearance is nonspecific but could indicate pneumonia. Followup PA and lateral chest X-ray is recommended in 3-4 weeks following trial of antibiotic therapy to ensure resolution and exclude underlying malignancy. Electronically Signed   By: Marisa Cyphers.D.  On: 02/22/2016 02:03   Dg Chest Port 1 View  02/29/2016  CLINICAL DATA:  Emphysema, right chest tube. EXAM: PORTABLE CHEST 1 VIEW COMPARISON:  February 28, 2016 FINDINGS: The heart size and mediastinal contours are stable. Two right-sided chest tubes are identified unchanged. There is no pneumothorax. Patchy consolidation of the right mid and lung base are noted. There is a small right pleural effusion. There is probably asymmetric pulmonary vascular congestion of the right lung. The left lung is clear. The visualized skeletal structures are stable. IMPRESSION: Right-sided chest tubes unchanged compared to previous exam. There is no pneumothorax. Patchy consolidation of the right mid and lung base unchanged compared to prior exam. Small right pleural effusion. Asymmetric pulmonary vascular congestion of right lung. Electronically Signed   By: Sherian Rein M.D.   On: 02/29/2016 07:25    Dg Chest Port 1 View  02/28/2016  CLINICAL DATA:  Empyema. EXAM: PORTABLE CHEST 1 VIEW COMPARISON:  02/27/2016. FINDINGS: Right chest tubes in stable position . Heart size normal. Persistent infiltrate and/or atelectasis in the right lower lobe . Mild infiltrate and/or atelectasis right upper lobe. Small right pleural effusion cannot be excluded. No pneumothorax. IMPRESSION: 1. Right chest tubes in stable position.  No pneumothorax. 2. Persistent right lower lobe atelectasis and or infiltrate. Mild infiltrate and/or atelectasis right upper lobe. Electronically Signed   By: Maisie Fus  Register   On: 02/28/2016 07:11   Dg Chest Port 1 View  02/27/2016  CLINICAL DATA:  Followup right empyema. Community acquired pneumonia. Acute respiratory failure. EXAM: PORTABLE CHEST 1 VIEW COMPARISON:  02/26/2016 FINDINGS: Endotracheal tube and nasogastric tube have been removed. 3 right chest tubes remain in place as well as right internal jugular central venous catheter. Right lower lung airspace disease shows no significant interval change. Mild right pleural thickening is stable. No pneumothorax visualized. Left lung is clear. Heart size is stable. IMPRESSION: No significant change in right lower lung airspace disease following extubation. Stable right pleural thickening.  No pneumothorax visualized. Electronically Signed   By: Myles Rosenthal M.D.   On: 02/27/2016 09:04   Dg Chest Port 1 View  02/26/2016  CLINICAL DATA:  Status post evacuation of a right-sided empyema. EXAM: PORTABLE CHEST 1 VIEW COMPARISON:  02/25/2016 FINDINGS: The 3 right-sided chest tubes are stable. There is persistent opacity at the right lung base obscuring hemidiaphragm. There is central interstitial thickening and vascular congestion on the right accentuated by low lung volume. This is also stable. Mild streaky subsegmental atelectasis at the left lung base. Left lung otherwise clear. Cardiac silhouette is normal in size. Endotracheal tube,  oral/nasogastric tube and right internal jugular central venous line are stable and well positioned. No evidence of a pneumothorax. IMPRESSION: 1. No significant change from the previous day's study. 2. Persistent postoperative changes on the right. No convincing pneumothorax. 3. Right lung base opacity is likely combination of residual pleural fluid, atelectasis and possibly pneumonia. No pulmonary edema. 4. Support apparatus is stable and well positioned. Electronically Signed   By: Amie Portland M.D.   On: 02/26/2016 08:12   Dg Chest Port 1 View  02/25/2016  CLINICAL DATA:  Follow-up empyema drainage on the right; community-acquired pneumonia, acute respiratory failure, intubated patient. EXAM: PORTABLE CHEST 1 VIEW COMPARISON:  Portable chest x-ray dated February 24, 2016 FINDINGS: The lungs are slightly less well inflated today. On the right somewhat increased density at the lung bases present accentuated by hypo inflation. The 3 chest tubes are in stable position. On  the left there is no alveolar infiltrate. Minimal vascular crowding is noted at the left lung base. The cardiac silhouette remains enlarged. The pulmonary vascularity is engorged centrally. The endotracheal tube tip lies 3.6 cm above the carina. The esophagogastric tube tip projects below the inferior margin of the image. The right internal jugular venous catheter tip projects over the proximal SVC. IMPRESSION: Mild bilateral hypo inflation accentuates the lung markings bilaterally. There remains a small amount of pleural fluid and/or pleural thickening on the right. No pneumothorax is evident. Stable moderate CHF. The support tubes are in reasonable position. Electronically Signed   By: David  SwazilandJordan M.D.   On: 02/25/2016 07:31   Dg Chest Port 1 View  02/24/2016  CLINICAL DATA:  Empyema status post right VATS. Central line placement EXAM: PORTABLE CHEST 1 VIEW COMPARISON:  Chest radiograph from earlier today. FINDINGS: Endotracheal tube tip is  4 cm above the carina. Enteric tube enters stomach with the tip not seen on this image. Right internal jugular central venous catheter terminates in the upper third of the superior vena cava. Right apical and right basilar chest tubes are in position. Stable cardiomediastinal silhouette with normal heart size. No pneumothorax. Small residual right pleural effusion, significantly decreased. No left pleural effusion. Improved aeration in the right lung with persistent patchy opacity throughout the right lung, most prominent at the right lung base. No acute consolidative airspace disease in the left lung. Expected mild subcutaneous emphysema in the lateral lower right chest wall. IMPRESSION: 1. No pneumothorax. 2. Well-positioned support structures as described. 3. Small residual right pleural effusion, significantly decreased. 4. Improved aeration in the right lung with persistent patchy opacity throughout the right lung most prominent at the right lung base, suggestive of residual atelectasis and/or pneumonia. Electronically Signed   By: Delbert PhenixJason A Poff M.D.   On: 02/24/2016 12:34   Dg Chest Port 1 View  02/24/2016  CLINICAL DATA:  Endotracheal tube placement. Tube was advanced 4 cm. EXAM: PORTABLE CHEST 1 VIEW COMPARISON:  Yesterday at 1925 hours FINDINGS: Endotracheal tube is been advanced, now 4.5 cm from the carina. Enteric tube in place, tip below the diaphragm not included in the field of view. Right lung opacity consistent with large partially loculated pleural effusion is unchanged from prior exam. Cardiomediastinal contours are unchanged. Left basilar atelectasis is again seen, with mild improvement. IMPRESSION: 1. Endotracheal tube no 4.5 cm from the carina, in appropriate position. 2. Large partially loculated right pleural effusion is unchanged. Electronically Signed   By: Rubye OaksMelanie  Ehinger M.D.   On: 02/24/2016 00:33   Dg Chest Port 1 View  02/23/2016  CLINICAL DATA:  Endotracheal placement. EXAM:  PORTABLE CHEST 1 VIEW COMPARISON:  02/22/2016 FINDINGS: Endotracheal tube has its tip 8 cm above the carina. This could be advanced. Nasogastric tube enters the abdomen. Large loculated pleural fluid collection on the right with compressive atelectasis of the right lung. Some aeration of the right upper lobe. Left lung shows mild infiltrate or atelectasis at the base. IMPRESSION: Endotracheal tube tip 8 cm above the carina.  Consider advancing. Large loculated pleural fluid collection on the right with compressive atelectasis of the right lung. Some aeration in the right upper lung. Mild atelectasis or infiltrate at the left base. Electronically Signed   By: Paulina FusiMark  Shogry M.D.   On: 02/23/2016 19:34   Dg Abd Portable 1v  02/26/2016  CLINICAL DATA:  Abdominal pain, distension and gas s/p VATS on 02/24/2016. EXAM: PORTABLE ABDOMEN - 1 VIEW COMPARISON:  02/26/2016 FINDINGS: Nasogastric tube is in place, tip overlying the level of the third portion of the duodenum. There is mild dilatation of central small bowel loops. Gas is identified within nondilated loops of large bowel. No evidence for free intraperitoneal air. Right-sided chest tube is identified at the lung base. IMPRESSION: Nasogastric tube tip overlying the level of the third portion of duodenum. Mildly dilated central small bowel loops. Electronically Signed   By: Norva Pavlov M.D.   On: 02/26/2016 10:28   US Thoracentesis Asp Pleural Space W/img Guide  02/22/2016  INDICATION: Loculated RIGHT pleural effusion, lung consolidation EXAM: ULTRASOUND GUIDED DIAGNOSTIC RIGHT THORACENTESIS PROCEDURE: Procedure, benefits, and risks of procedure were discussed with patient. Written informed consent for procedure was obtained. Time out protocol followed. Pleural effusion localized by ultrasound at the posterior RIGHT hemithorax. Fluid appeared mildly complicated and partially loculated. Skin prepped and draped in usual sterile fashion. Skin and soft tissues  anesthetized with 10 mL of 1% lidocaine. Due to persistent pain, additional anesthesia with 5 mL of 2% lidocaine was utilized locally. Five [place] Yueh catheter placed into the RIGHT pleural space. 180 mL of yellow RIGHT pleural fluid was aspirated by syringe pump. Procedure tolerated well by patient without immediate complication. FINDINGS: The fluid sample of 180 mL was sent for laboratory analysis. IMPRESSION: Successful ultrasound guided diagnostic RIGHT thoracentesis yielding 180 mL of pleural fluid. Electronically Signed   By: Ulyses Southward M.D.   On: 02/22/2016 11:39    Microbiology: Recent Results (from the past 240 hour(s))  Culture, blood (Routine X 2) w Reflex to ID Panel     Status: None   Collection Time: 02/22/16  3:00 AM  Result Value Ref Range Status   Specimen Description BLOOD RIGHT ANTECUBITAL  Final   Special Requests   Final    BOTTLES DRAWN AEROBIC AND ANAEROBIC AEB 10CC ANA 6CC   Culture NO GROWTH 6 DAYS  Final   Report Status 02/28/2016 FINAL  Final  MRSA PCR Screening     Status: None   Collection Time: 02/22/16  3:25 AM  Result Value Ref Range Status   MRSA by PCR NEGATIVE NEGATIVE Final    Comment:        The GeneXpert MRSA Assay (FDA approved for NASAL specimens only), is one component of a comprehensive MRSA colonization surveillance program. It is not intended to diagnose MRSA infection nor to guide or monitor treatment for MRSA infections.   Culture, blood (Routine X 2) w Reflex to ID Panel     Status: None   Collection Time: 02/22/16  3:35 AM  Result Value Ref Range Status   Specimen Description BLOOD LEFT HAND  Final   Special Requests   Final    BOTTLES DRAWN AEROBIC AND ANAEROBIC AEB 12CC ANA 8CC   Culture NO GROWTH 6 DAYS  Final   Report Status 02/28/2016 FINAL  Final  Culture, body fluid-bottle     Status: None   Collection Time: 02/22/16  8:15 AM  Result Value Ref Range Status   Specimen Description LUNG RIGHT  Final   Special Requests NONE  10CC EACH  Final   Culture NO GROWTH 6 DAYS  Final   Report Status 02/28/2016 FINAL  Final  Gram stain     Status: None   Collection Time: 02/22/16  8:15 AM  Result Value Ref Range Status   Specimen Description FLUID RIGHT PLEURAL COLLECTED BY DOCTOR  Final   Special Requests NONE  Final   Gram  Stain   Final    NO ORGANISMS SEEN ABUNDANT WBC PRESENT, PREDOMINANTLY PMN Performed at [facility]    Report Status 03/01/2016 FINAL  Final  Culture, respiratory (NON-Expectorated)     Status: None   Collection Time: 02/23/16  8:22 PM  Result Value Ref Range Status   Specimen Description TRACHEAL ASPIRATE  Final   Special Requests Normal  Final   Gram Stain   Final    MODERATE WBC PRESENT,BOTH PMN AND MONONUCLEAR NO SQUAMOUS EPITHELIAL CELLS SEEN RARE GRAM POSITIVE COCCI IN PAIRS Performed at [facility]    Culture   Final    NORMAL OROPHARYNGEAL FLORA Performed at [facility]    Report Status 02/28/2016 FINAL  Final  Surgical pcr screen     Status: None   Collection Time: 02/24/16  3:26 AM  Result Value Ref Range Status   MRSA, PCR NEGATIVE NEGATIVE Final   Staphylococcus aureus NEGATIVE NEGATIVE Final    Comment:        The Xpert SA Assay (FDA approved for NASAL specimens in patients over 63 years of age), is one component of a comprehensive surveillance program.  Test performance has been validated by [facility] for patients greater than or equal to 37 year old. It is not intended to diagnose infection nor to guide or monitor treatment.   Fungus Culture With Stain (Not @ [facility])     Status: None (Preliminary result)   Collection Time: 02/24/16  8:55 AM  Result Value Ref Range Status   Fungus Stain Final report  Final    Comment: (NOTE) Performed At: [facility] [address] [place], [place] [id number] Mila Homer MD WU:[id number]    Fungus (Mycology) Culture PENDING  Incomplete   Fungal Source FLUID  Final    Comment:  PLEURAL RIGHT POF ZOSYN AND VANCOMYCIN   Acid Fast Smear (AFB)     Status: None   Collection Time: 02/24/16  8:55 AM  Result Value Ref Range Status   AFB Specimen Processing Concentration  Final   Acid Fast Smear Negative  Final    Comment: (NOTE) Performed At: [facility] [address] [place], [place] [id number] Mila Homer MD VH:[id number]    Source (AFB) FLUID  Final    Comment: PLEURAL RIGHT POF ZOSYN AND VANCOMYCIN   Culture, body fluid-bottle     Status: None   Collection Time: 02/24/16  8:55 AM  Result Value Ref Range Status   Specimen Description FLUID RIGHT LUNG  Final   Special Requests POF ZOSYN AND VANCOMYCIN  Final   Culture NO GROWTH 5 DAYS  Final   Report Status 02/29/2016 FINAL  Final  Gram stain     Status: None   Collection Time: 02/24/16  8:55 AM  Result Value Ref Range Status   Specimen Description FLUID RIGHT LUNG  Final   Special Requests POF ZOSYN AND VANCOMYCIN  Final   Gram Stain   Final    MODERATE WBC PRESENT,BOTH PMN AND MONONUCLEAR NO ORGANISMS SEEN    Report Status 02/24/2016 FINAL  Final  Fungus Culture Result     Status: None   Collection Time: 02/24/16  8:55 AM  Result Value Ref Range Status   Result 1 Comment  Final    Comment: (NOTE) KOH/Calcofluor preparation:  no fungus observed. Performed At: [facility] [address] [place], [place] [id number] Mila Homer MD UV:[id number]   Tissue culture     Status: None  Collection Time: 02/24/16  9:03 AM  Result Value Ref Range Status   Specimen Description TISSUE RIGHT LUNG  Final   Special Requests PLEURAL PEEL POF ZOSYN AND VANCOMYCIN  Final   Gram Stain   Final    ABUNDANT WBC PRESENT, PREDOMINANTLY PMN NO ORGANISMS SEEN Performed at [facility]    Culture   Final    NO GROWTH 3 DAYS Performed at [facility]    Report Status 02/28/2016 FINAL  Final  Fungus Culture With Stain (Not @ [facility])     Status: None (Preliminary result)    Collection Time: 02/24/16  9:03 AM  Result Value Ref Range Status   Fungus Stain Final report  Final    Comment: (NOTE) Performed At: [facility] [address] [place], [place] [id number] Mila Homer MD WU:[id number]    Fungus (Mycology) Culture PENDING  Incomplete   Fungal Source TISSUE  Final    Comment: RIGHT LUNG PLEURAL PEEL POF ZOSYN AND VANCOMYCIN   Acid Fast Smear (AFB)     Status: None   Collection Time: 02/24/16  9:03 AM  Result Value Ref Range Status   AFB Specimen Processing Comment  Final    Comment: Tissue Grinding and Digestion/Decontamination   Acid Fast Smear Negative  Final    Comment: (NOTE) Performed At: [facility] [address] [place], [place] [id number] Mila Homer MD VH:[id number]    Source (AFB) TISSUE  Final    Comment: RIGHT LUNG PLEURAL PEEL POF ZOSYN AND VANCOMYCIN   Fungus Culture Result     Status: None   Collection Time: 02/24/16  9:03 AM  Result Value Ref Range Status   Result 1 Comment  Final    Comment: (NOTE) KOH/Calcofluor preparation:  no fungus observed. Performed At: [facility] [address] [place], [place] [id number] Mila Homer MD UV:[id number]      Labs: Basic Metabolic Panel:  Recent Labs Lab 02/25/16 0453 02/25/16 1217 02/25/16 2323 02/26/16 7425 02/27/16 0415 02/28/16 0344 03/01/16 0533 03/02/16 0332  NA 138  --   --  140 141 141 135 139  K 3.9  --   --  4.0 3.4* 5.0 4.2 4.2  CL 101  --   --  102 99* 108 103 104  CO2 25  --   --  29 32 GLUCOSE 137*  --   --  112* 124* 124* 110* 115*  BUN 17  --   --  17 24* CREATININE 1.03  --   --  0.90 1.01 0.92 0.88 0.95  CALCIUM 7.7*  --   --  8.0* 8.2* 7.9* 8.1* 8.2*  MG 2.1 2.2 2.2 2.0 2.1  --  2.0 2.1  PHOS 3.8 4.2 3.5 3.6 3.8  --   --   --    Liver Function Tests:  Recent Labs Lab 02/26/16 0213 02/28/16 0344 03/01/16 0533 03/02/16 0332  AST 54* 74* 31 25  ALT 42 45 46 42  ALKPHOS 44 45 51  55  BILITOT 1.8* 1.6* 0.9 0.7  PROT 4.7* 4.9* 5.5* 5.7*  ALBUMIN 1.8* 2.1* 2.3* 2.3*   No results for input(s): LIPASE, AMYLASE in the last 168 hours. No results for input(s): AMMONIA in the last 168 hours. CBC:  Recent Labs Lab 02/25/16 0453 02/26/16 0213 02/27/16 0415 03/01/16 0533 03/02/16 0332  WBC 7.3 5.8 5.8 8.4 7.3  NEUTROABS 5.6 4.2 3.8 5.7 5.2  HGB 11.6*  10.2* 10.3* 11.2* 10.4*  HCT 35.7* 32.7* 33.0* 33.6* 32.6*  MCV 89.0 91.6 91.2 88.0 88.1  PLT 195 182 231 288 269   Cardiac Enzymes: No results for input(s): CKTOTAL, CKMB, CKMBINDEX, TROPONINI in the last 168 hours. BNP: BNP (last 3 results)  Recent Labs  02/22/16 0042  BNP 30.0    ProBNP (last 3 results) No results for input(s): PROBNP in the last 8760 hours.  CBG:  Recent Labs Lab 02/26/16 2348 02/27/16 0356 02/27/16 0819 02/27/16 1224 02/27/16 1612  GLUCAP 110* 112* 138* 117* 136*       Signed:  Carolyne Littles, MD [facility] [phone] pager

## 2016-03-02 NOTE — Discharge Instructions (Signed)
Thoracotomy, Care After °Refer to this sheet in the next few weeks. These instructions provide you with information on caring for yourself after your procedure. Your health care provider may also give you more specific instructions. Your treatment has been planned according to current medical practices, but problems sometimes occur. Call your health care provider if you have any problems or questions after your procedure. °WHAT TO EXPECT AFTER YOUR PROCEDURE °After your procedure, it is typical to have the following sensations: °· You may feel pain at the incision site. °· You may be constipated from the pain medicine given and the change in your level of activity. °· You may feel extremely tired. °HOME CARE INSTRUCTIONS °· Take over-the-counter or prescription medicines for pain, discomfort, or fever only as directed by your health care provider. It is very important to take pain relieving medicine before your pain becomes severe. You will be able to breathe and cough more comfortably if your pain is well controlled. °· Take deep breaths. Deep breathing helps to keep your lungs inflated and protects against a lung infection (pneumonia). °· Cough frequently. Even though coughing may cause discomfort, coughing is important to clear mucus (phlegm) and expand your lungs. Coughing helps prevent pneumonia. If it hurts to cough, hold a pillow against your chest when you cough. This may help with the discomfort. °· Continue to use an incentive spirometer as directed. The use of an incentive spirometer helps to keep your lungs inflated and protects against pneumonia. °· Change the bandages over your incision as needed or as directed by your health care provider. °· Remove the bandages over your chest tube site as directed by your health care provider. °· Resume your normal diet as directed. It is important to have adequate protein, calories, vitamins, and minerals to promote healing. °· Prevent constipation. °¨ Eat  high-fiber foods such as whole grain cereals and breads, brown rice, beans, and fresh fruits and vegetables. °¨ Drink enough water and fluids to keep your urine clear or pale yellow. Avoid drinking beverages containing caffeine. Beverages containing caffeine can cause dehydration and harden your stool. °¨ Talk to your health care provider about taking a stool softener or laxative. °· Avoid lifting until you are instructed otherwise. °· Do not drive until directed by your health care provider.  Do not drive while taking pain medicines (narcotics). °· Do not bathe, swim, or use a hot tub until directed by your health care provider. You may shower instead. Gently wash the area of your incision with water and soap as directed. Do not use anything else to clean your incision except as directed by your health care provider. °· Do not use any tobacco products including cigarettes, chewing tobacco, or electronic cigarettes. °· Avoid secondhand smoke. °· Schedule an appointment for stitch (suture) or staple removal as directed. °· Schedule and attend all follow-up visits as directed by your health care provider. It is important to keep all your appointments. °· Participate in pulmonary rehabilitation as directed by your health care provider. °· Do not travel by airplane for 2 weeks after your chest tube is removed. °SEEK MEDICAL CARE IF: °· You are bleeding from your wounds. °· Your heartbeat seems irregular. °· You have redness, swelling, or increasing pain in the wounds. °· There is pus coming from your wounds. °· There is a bad smell coming from the wound or dressing. °· You have a fever or chills. °· You have nausea or are vomiting. °· You have muscle aches. °SEEK   IMMEDIATE MEDICAL CARE IF: °· You have a rash. °· You have difficulty breathing. °· You have a reaction or side effect to medicines given. °· You have persistent nausea. °· You have lightheadedness or feel faint. °· You have shortness of breath or chest  pain. °· You have persistent pain. °  °This information is not intended to replace advice given to you by your health care provider. Make sure you discuss any questions you have with your health care provider. °  °Document Released: 04/07/2011 Document Revised: 03/09/2015 Document Reviewed: 06/11/2013 °Elsevier Interactive Patient Education ©2016 [employer] Inc[employer] ° °

## 2016-03-02 NOTE — Progress Notes (Signed)
Discharge instructions given to patient and wife. Both verbalized understanding of discharge instructions. Pain medication prescription given to wife. Patient wheeled to vehicle via wheelchair. Luther Parodyaitlin Lum KeasFoecking,RN

## 2016-03-02 NOTE — Progress Notes (Signed)
Patient c/o pain several times this night, especially after walking, and was given PRN oxy and fentanyl. Nasal canula applied prior to patient going to bed. Patient sat's decreased several time this night to the mid 80's. On assessment patient was not wear the nasal cannula when the sats dropped. When the nasal cannula was reapplied, patient's sat's increased and document in the flow sheets.

## 2016-03-02 NOTE — Progress Notes (Signed)
Patient asked if RN can bring the pain medication to him at a set time the way he has been getting it to help him sleep. Patient educated on the terms of as needed pain medications and that RN will assess pain when there is a report of pain from the patient, then administer pain medication as ordered by MD. Patient also educated on the need to decrease narcotic pain medication, especially the IV fentanyl, as his condition improves. Patient was also informed on the risk of difficulty breathing when taking narcotic pain medication. Patient educated on the difference between pain medication and sleep aid. . Patient agreed to call for the pain medication only when pain is present.

## 2016-03-02 NOTE — Progress Notes (Signed)
      [address]       [place] [id number]             [phone]      7 Days Post-Op Procedure(s) (LRB): VIDEO ASSISTED THORACOSCOPY Right (VATS) for [facility]/EMPYEMA, decortication of lung (Right)   Subjective:  Mr. Jone Basemanurdy has no complaints.  He is hopeful to be discharged today.  Objective: Vital signs in last 24 hours: Temp:  [97.4 F (36.3 C)-98.7 F (37.1 C)] 98.3 F (36.8 C) (04/27 0727) Pulse Rate:  [73-102] 86 (04/27 0727) Cardiac Rhythm:  [-] Normal sinus rhythm (04/27 0745) Resp:  [18-32] 20 (04/27 0727) BP: (110-145)/(63-72) 115/63 mmHg (04/27 0727) SpO2:  [84 %-96 %] 95 % (04/27 0727)  Intake/Output from previous day: 04/26 0701 - 04/27 0700 In: 480 [P.O.:480] Out: 150 [Urine:150]  General appearance: alert, cooperative and no distress Heart: regular rate and rhythm Lungs: diminished breath sounds right Abdomen: soft, non-tender; bowel sounds normal; no masses,  no organomegaly Wound: clean and dry  Lab Results:  Recent Labs  03/01/16 0533 03/02/16 0332  WBC 8.4 7.3  HGB 11.2* 10.4*  HCT 33.6* 32.6*  PLT 288 269   BMET:  Recent Labs  03/01/16 0533 03/02/16 0332  NA 135 139  K 4.2 4.2  CL 103 104  CO2 22 27  GLUCOSE 110* 115*  BUN 17 15  CREATININE 0.88 0.95  CALCIUM 8.1* 8.2*    PT/INR: No results for input(s): LABPROT, INR in the last 72 hours. ABG    Component Value Date/Time   PHART 7.431 02/25/2016 0330   HCO3 27.5* 02/25/2016 0330   TCO2 28.8 02/25/2016 0330   ACIDBASEDEF 2.0 02/22/2016 0235   O2SAT 98.3 02/25/2016 0330   CBG (last 3)  No results for input(s): GLUCAP in the last 72 hours.  Assessment/Plan: S/P Procedure(s) (LRB): VIDEO ASSISTED THORACOSCOPY Right (VATS) for /EMPYEMA, decortication of lung (Right)  1. Pulm- final chest tube removed yesterday, CXR shows no pneumothorax, small pleural effusion, residual pleural scarring is stable 2. ID- Empyema, OR cultures remain negative, continue Augmentin at  discharge 3. Dispo- patient stable, Ok to d/c home from surgery standpoint, will have patient return to office in 2 weeks   LOS: 9 days    Raford PitcherBARRETT, Denny PeonRIN 03/02/2016

## 2016-03-04 ENCOUNTER — Telehealth: Payer: Self-pay | Admitting: Thoracic Surgery (Cardiothoracic Vascular Surgery)

## 2016-03-04 MED ORDER — AMOXICILLIN 875 MG PO TABS: 875.0000 mg | ORAL_TABLET | Freq: Two times a day (BID) | ORAL | Status: DC

## 2016-03-04 NOTE — Telephone Encounter (Signed)
Mr. [name] Snow was discharged 4/27 with a prescription for augmentin  Mrs. [name] Snow called last night to report that they could not fill the prescription due to cost  I put in a prescription for Amoxicillin 875 mg BID for 2 weeks [facility] [place]

## 2016-03-06 ENCOUNTER — Other Ambulatory Visit: Payer: Self-pay | Admitting: *Deleted

## 2016-03-06 DIAGNOSIS — G8918 Other acute postprocedural pain: Secondary | ICD-10-CM

## 2016-03-06 MED ORDER — OXYCODONE HCL 5 MG PO TABS: 5.0000 mg | ORAL_TABLET | ORAL | Status: DC | PRN

## 2016-03-09 ENCOUNTER — Ambulatory Visit (INDEPENDENT_AMBULATORY_CARE_PROVIDER_SITE_OTHER): Payer: Self-pay | Admitting: Family Medicine

## 2016-03-09 ENCOUNTER — Encounter: Payer: Self-pay | Admitting: Family Medicine

## 2016-03-09 VITALS — BP 129/72 | HR 91 | Temp 97.8°F | Resp 18 | Ht 73.0 in | Wt 218.0 lb

## 2016-03-09 DIAGNOSIS — J984 Other disorders of lung: Secondary | ICD-10-CM

## 2016-03-09 DIAGNOSIS — Z7189 Other specified counseling: Secondary | ICD-10-CM

## 2016-03-09 DIAGNOSIS — Z7689 Persons encountering health services in other specified circumstances: Secondary | ICD-10-CM

## 2016-03-09 DIAGNOSIS — J189 Pneumonia, unspecified organism: Secondary | ICD-10-CM

## 2016-03-09 NOTE — Patient Instructions (Signed)
Return in one month for follow-up and blood-work.

## 2016-03-10 NOTE — Progress Notes (Signed)
Patient ID: [name] Snow, male   DOB: [birth year], 40 y.o.   MRN: [id number]   [name] FritzJames Jaimes, is a 40 y.o. male  [id number]  [id number]  DOB - [birth year]  CC:  Chief Complaint  Patient presents with  . Establish Care  . Hospitalization Follow-up    pneumonia. numbess in abdomen where surgery was.        HPI: [name] Snow is a 40 y.o. male here to establish care. He was recently in hospital for empyema, plural effusion. He had a thoracentesis to drain the effusion. Presumedly this was caused by pigeon dropings at work. It is suspected this has been brewing for several months. He has follow-up scheduled with surgeon but was asked to come here to establish with primary care.  He does not have a history of chronic illnesses. He is on no chronic medications. He is currently in the process of applying for medicaid. He does not have any time of finanacial coverage. He would like to hold off on health maintenance issues until he has some type of coverage.  No Known Allergies Past Medical History  Diagnosis Date  . Asthma    Current Outpatient Prescriptions on File Prior to Visit  Medication Sig Dispense Refill  . amoxicillin (AMOXIL) 875 MG tablet Take 1 tablet (875 mg total) by mouth 2 (two) times daily. 28 tablet 0  . cyclobenzaprine (FLEXERIL) 10 MG tablet Take 1 tablet (10 mg total) by mouth 2 (two) times daily as needed for muscle spasms. 20 tablet 0  . ibuprofen (ADVIL,MOTRIN) 800 MG tablet Take 1 tablet (800 mg total) by mouth every 8 (eight) hours as needed for moderate pain. 21 tablet 0  . oxyCODONE (OXY IR/ROXICODONE) 5 MG immediate release tablet Take 1-2 tablets (5-10 mg total) by mouth every 3 (three) hours as needed for moderate pain. 40 tablet 0  . oxyCODONE-acetaminophen (PERCOCET/ROXICET) 5-325 MG tablet Take 2 tablets by mouth every 4 (four) hours as needed for severe pain. (Patient not taking: Reported on 02/21/2016) 6 tablet 0   No current facility-administered  medications on file prior to visit.   History reviewed. No pertinent family history. Social History   Social History  . Marital Status: Single    Spouse Name: N/A  . Number of Children: N/A  . Years of Education: N/A   Occupational History  . Not on file.   Social History Main Topics  . Smoking status: Current Every Day Smoker    Types: Cigarettes  . Smokeless tobacco: Not on file  . Alcohol Use: No     Comment: occ  . Drug Use: No  . Sexual Activity: Not on file   Other Topics Concern  . Not on file   Social History Narrative    Review of Systems: Constitutional: Negative for fever, chills, appetite change, weight loss,  Fatigue. Skin: Negative for rashes or lesions of concern. HENT: Negative for ear pain, ear discharge.nose bleeds Eyes: Negative for pain, discharge, redness, itching and visual disturbance. Neck: Negative for pain, stiffness Respiratory: Negative for cough, shortness of breath,   Cardiovascular: Negative for chest pain, palpitations and leg swelling. Gastrointestinal: Negative for abdominal pain, nausea, vomiting, diarrhea, constipations Genitourinary: Negative for dysuria, urgency, frequency, hematuria,  Musculoskeletal: Negative for back pain, joint pain, joint  swelling, and gait problem.Negative for weakness. Neurological: Negative for dizziness, tremors, seizures, syncope,   light-headedness, numbness and headaches.  Hematological: Negative for easy bruising or bleeding Psychiatric/Behavioral: Negative for depression, anxiety, decreased concentration, confusion  Objective:   Filed Vitals:   03/09/16 1444  BP: 129/72  Pulse: 91  Temp: 97.8 F (36.6 C)  Resp: 18    Physical Exam: Constitutional: Patient appears well-developed and well-nourished. No distress. HENT: Normocephalic, atraumatic, External right and left ear normal. Oropharynx is clear and moist.  Eyes: Conjunctivae and EOM are normal. PERRLA, no scleral icterus. Neck: Normal  ROM. Neck supple. No lymphadenopathy, No thyromegaly. CVS: RRR, S1/S2 +, no murmurs, no gallops, no rubs Pulmonary: Effort and breath sounds normal, no stridor, rhonchi, wheezes, rales.  Abdominal: Soft. Normoactive BS,, no distension, tenderness, rebound or guarding.  Musculoskeletal: Normal range of motion. No edema and no tenderness.  Neuro: Alert.Normal muscle tone coordination. Non-focal Skin: Skin is warm and dry. No rash noted. Not diaphoretic. No erythema. No pallor. Psychiatric: Normal mood and affect. Behavior, judgment, thought content normal.  Lab Results  Component Value Date   WBC 7.3 03/02/2016   HGB 10.4* 03/02/2016   HCT 32.6* 03/02/2016   MCV 88.1 03/02/2016   PLT 269 03/02/2016   Lab Results  Component Value Date   CREATININE 0.95 03/02/2016   BUN 15 03/02/2016   NA 139 03/02/2016   K 4.2 03/02/2016   CL 104 03/02/2016   CO2 27 03/02/2016    No results found for: HGBA1C Lipid Panel     Component Value Date/Time   TRIG 150* 02/23/2016 1937       Assessment and plan:  1. Visit to establish care -I have reviewed information provided by the patient and pertinent, available information in his chart.  2. Resolving lung infection -Follow-up with surgeon as planned.  Return in about 4 weeks (around 04/06/2016).For health maintenance.  The patient was given clear instructions to go to ER or return to medical center if symptoms don't improve, worsen or new problems develop. The patient verbalized understanding.    Henrietta Hoover FNP  03/10/2016, 3:34 PM

## 2016-03-13 ENCOUNTER — Other Ambulatory Visit: Payer: Self-pay | Admitting: *Deleted

## 2016-03-13 DIAGNOSIS — G8918 Other acute postprocedural pain: Secondary | ICD-10-CM

## 2016-03-13 MED ORDER — OXYCODONE HCL 5 MG PO TABS: 5.0000 mg | ORAL_TABLET | ORAL | Status: DC | PRN

## 2016-03-14 ENCOUNTER — Other Ambulatory Visit: Payer: Self-pay | Admitting: Cardiothoracic Surgery

## 2016-03-14 DIAGNOSIS — J869 Pyothorax without fistula: Secondary | ICD-10-CM

## 2016-03-15 ENCOUNTER — Encounter: Payer: Self-pay | Admitting: Cardiothoracic Surgery

## 2016-03-15 ENCOUNTER — Ambulatory Visit (INDEPENDENT_AMBULATORY_CARE_PROVIDER_SITE_OTHER): Payer: Self-pay | Admitting: Cardiothoracic Surgery

## 2016-03-15 ENCOUNTER — Ambulatory Visit
Admission: RE | Admit: 2016-03-15 | Discharge: 2016-03-15 | Disposition: A | Discharge status: Home / Self Care | Payer: No Typology Code available for payment source | Source: Ambulatory Visit | Attending: Cardiothoracic Surgery | Admitting: Cardiothoracic Surgery

## 2016-03-15 VITALS — BP 126/80 | HR 96 | Resp 20 | Ht 73.0 in | Wt 218.0 lb

## 2016-03-15 DIAGNOSIS — J869 Pyothorax without fistula: Secondary | ICD-10-CM

## 2016-03-15 NOTE — Progress Notes (Signed)
PCP is Concepcion LivingBERNHARDT, LINDA, NP Referring Provider is Henrietta HooverBernhardt, Linda C, NP  Chief Complaint  Patient presents with  . Routine Post Op    f/u from surgery with XCR s/p Rt VATS,drainage of empyema .Mini thoracotomy for decortication of right lung    ZOX:WRUEAVWHPI:Patient returns for scheduled first postop office visit after right VATS for drainage of empyema and decortication of lung. His appetite and strength are significantly improved. The surgical incision is healing well. He still has considerable postthoracotomy pain symptoms. No shortness of breath or productive cough He is finishing his discharge antibiotic prescription.   Past Medical History  Diagnosis Date  . Asthma     Past Surgical History  Procedure Laterality Date  . Video assisted thoracoscopy (vats)/empyema Right 02/24/2016    Procedure: VIDEO ASSISTED THORACOSCOPY Right (VATS) for [facility]/EMPYEMA, decortication of lung;  Surgeon: Kerin PernaPeter Van Trigt, MD;  [facility] OR;  Service: Thoracic;  Laterality: Right;    No family history on file.  Social History Social History  Substance Use Topics  . Smoking status: Current Every Day Smoker    Types: Cigarettes  . Smokeless tobacco: None  . Alcohol Use: No     Comment: occ    Current Outpatient Prescriptions  Medication Sig Dispense Refill  . amoxicillin (AMOXIL) 875 MG tablet Take 1 tablet (875 mg total) by mouth 2 (two) times daily. 28 tablet 0  . cyclobenzaprine (FLEXERIL) 10 MG tablet Take 1 tablet (10 mg total) by mouth 2 (two) times daily as needed for muscle spasms. 20 tablet 0  . ibuprofen (ADVIL,MOTRIN) 800 MG tablet Take 1 tablet (800 mg total) by mouth every 8 (eight) hours as needed for moderate pain. 21 tablet 0  . oxyCODONE (OXY IR/ROXICODONE) 5 MG immediate release tablet Take 1-2 tablets (5-10 mg total) by mouth every 3 (three) hours as needed for moderate pain. 40 tablet 0   No current facility-administered medications for this visit.    No Known  Allergies  Review of Systems  Overall improving Interested in going back to work  BP 126/80 mmHg  Pulse 96  Resp 20  Ht 6\' 1"  (1.854 m)  Wt 218 lb (98.884 kg)  BMI 28.77 kg/m2  SpO2 98% Physical Exam Alert and comfortable Lungs clear Right VATS incision well-healed Chest tube sutures are removed No pedal edema Heart rhythm regular  Diagnostic Tests: Chest x-ray with resolution of empyema and improved aeration right lower lobe No residual effusion-some elevation of right hemidiaphragm Impression: Doing well after surgical therapy of right empyema He may resume driving in normal level activities He will return in 4 weeks for recheck to monitor progress  Plan:   Mikey BussingPeter Van Trigt III, MD [facility] [phone]

## 2016-03-22 ENCOUNTER — Other Ambulatory Visit: Payer: Self-pay | Admitting: *Deleted

## 2016-03-22 DIAGNOSIS — G8918 Other acute postprocedural pain: Secondary | ICD-10-CM

## 2016-03-22 MED ORDER — OXYCODONE-ACETAMINOPHEN 5-325 MG PO TABS: 1.0000 | ORAL_TABLET | Freq: Four times a day (QID) | ORAL | Status: DC | PRN

## 2016-03-23 LAB — FUNGUS CULTURE WITH STAIN

## 2016-03-23 LAB — FUNGAL ORGANISM REFLEX

## 2016-03-23 LAB — FUNGUS CULTURE RESULT

## 2016-04-07 ENCOUNTER — Ambulatory Visit: Payer: Self-pay | Admitting: Family Medicine

## 2016-04-10 ENCOUNTER — Other Ambulatory Visit: Payer: Self-pay

## 2016-04-10 DIAGNOSIS — G8918 Other acute postprocedural pain: Secondary | ICD-10-CM

## 2016-04-10 MED ORDER — OXYCODONE-ACETAMINOPHEN 5-325 MG PO TABS: 1.0000 | ORAL_TABLET | Freq: Three times a day (TID) | ORAL | Status: DC | PRN

## 2016-04-18 ENCOUNTER — Other Ambulatory Visit: Payer: Self-pay | Admitting: Cardiothoracic Surgery

## 2016-04-18 DIAGNOSIS — J90 Pleural effusion, not elsewhere classified: Secondary | ICD-10-CM

## 2016-04-19 ENCOUNTER — Encounter: Payer: Self-pay | Admitting: Cardiothoracic Surgery

## 2016-04-20 ENCOUNTER — Ambulatory Visit: Payer: Self-pay | Admitting: Cardiothoracic Surgery

## 2016-04-27 ENCOUNTER — Other Ambulatory Visit: Payer: Self-pay | Admitting: *Deleted

## 2016-04-27 DIAGNOSIS — G8918 Other acute postprocedural pain: Secondary | ICD-10-CM

## 2016-04-27 MED ORDER — OXYCODONE-ACETAMINOPHEN 5-325 MG PO TABS: 1.0000 | ORAL_TABLET | Freq: Three times a day (TID) | ORAL | Status: DC | PRN

## 2016-04-27 NOTE — Telephone Encounter (Signed)
Mr. [name] Snow has called to request another refill for his Oxycodone s/p VATS for empyema two months ago. His last refill did last 17 days, but he has gone to work part time working with [employer] and says he it is very painful. He does have a f/u with Dr. Donata ClayVan Trigt next week. I told him a new script would be available today at our office and he agreed.

## 2016-05-02 ENCOUNTER — Ambulatory Visit (HOSPITAL_BASED_OUTPATIENT_CLINIC_OR_DEPARTMENT_OTHER): Payer: Self-pay | Attending: Cardiothoracic Surgery

## 2016-05-03 ENCOUNTER — Encounter: Payer: Self-pay | Admitting: Cardiothoracic Surgery

## 2016-05-10 ENCOUNTER — Ambulatory Visit
Admission: RE | Admit: 2016-05-10 | Discharge: 2016-05-10 | Disposition: A | Discharge status: Home / Self Care | Payer: No Typology Code available for payment source | Source: Ambulatory Visit | Attending: Cardiothoracic Surgery | Admitting: Cardiothoracic Surgery

## 2016-05-10 ENCOUNTER — Ambulatory Visit (INDEPENDENT_AMBULATORY_CARE_PROVIDER_SITE_OTHER): Payer: Self-pay | Admitting: Cardiothoracic Surgery

## 2016-05-10 ENCOUNTER — Encounter: Payer: Self-pay | Admitting: Cardiothoracic Surgery

## 2016-05-10 VITALS — BP 126/83 | HR 96 | Resp 20 | Ht 73.0 in | Wt 218.0 lb

## 2016-05-10 DIAGNOSIS — J869 Pyothorax without fistula: Secondary | ICD-10-CM

## 2016-05-10 DIAGNOSIS — J90 Pleural effusion, not elsewhere classified: Secondary | ICD-10-CM

## 2016-05-10 DIAGNOSIS — G8912 Acute post-thoracotomy pain: Secondary | ICD-10-CM

## 2016-05-10 NOTE — Progress Notes (Signed)
PCP is Concepcion LivingBERNHARDT, LINDA, NP Referring Provider is Henrietta HooverBernhardt, Linda C, NP  Chief Complaint  Patient presents with  . Routine Post Op    c/o knot on incision site HX of Right VATS, Mini thoracotomy 02/24/16, CXR prior    HPI: Patient had a right VATS for drainage of empyema and decortication of right lower lobe almost 3 months ago. He is back at full activity and working. He presents with concern that his VATS scar becomes thickened after a day at work. His chest x-ray today shows clear lung fields, no evidence recurrent empyema or effusion The patient is also requesting a refill for oxycodone for postthoracotomy pain. The patient is told that he can have no more oxycodone at 3 months after surgery due to the high risk of dependency. He was given a prescription for tramadol to be taken when necessary without refill. The patient is able to return to work and is at no limitations. He understands that the tenderness will get better and that the integrity of the incision is normal with some mild thickening from collagen scarring.   Past Medical History  Diagnosis Date  . Asthma     Past Surgical History  Procedure Laterality Date  . Video assisted thoracoscopy (vats)/empyema Right 02/24/2016    Procedure: VIDEO ASSISTED THORACOSCOPY Right (VATS) for [facility]/EMPYEMA, decortication of lung;  Surgeon: Kerin PernaPeter Van Trigt, MD;  [facility];  Service: Thoracic;  Laterality: Right;    No family history on file.  Social History Social History  Substance Use Topics  . Smoking status: Current Every Day Smoker    Types: Cigarettes  . Smokeless tobacco: None  . Alcohol Use: No     Comment: occ    Current Outpatient Prescriptions  Medication Sig Dispense Refill  . oxyCODONE-acetaminophen (ROXICET) 5-325 MG tablet Take 1 tablet by mouth every 8 (eight) hours as needed for severe pain. 40 tablet 0  . cyclobenzaprine (FLEXERIL) 10 MG tablet Take 1 tablet (10 mg total) by mouth 2 (two) times daily as  needed for muscle spasms. (Patient not taking: Reported on 05/10/2016) 20 tablet 0  . ibuprofen (ADVIL,MOTRIN) 800 MG tablet Take 1 tablet (800 mg total) by mouth every 8 (eight) hours as needed for moderate pain. (Patient not taking: Reported on 05/10/2016) 21 tablet 0   No current facility-administered medications for this visit.    No Known Allergies  Review of Systems  The patient still smokes 2-3 cigarettes daily. He is growing encouraged to stop. No productive cough or hemoptysis. No fever.  BP 126/83 mmHg  Pulse 96  Resp 20  Ht 6\' 1"  (1.854 m)  Wt 218 lb (98.884 kg)  BMI 28.77 kg/m2  SpO2 98% Physical Exam Alert and comfortable Lungs clear VATS incision intact dry and healing well, no abnormalities Heart rate regular  Diagnostic Tests: Chest x-ray clear  Impression: Normal recovery after right VATS  Plan:No more narcotics Patient will try tramadol and then transition to Tylenol or OTC meds Return as needed  Mikey BussingPeter Van Trigt III, MD [facility] [phone]

## 2016-07-11 ENCOUNTER — Emergency Department (HOSPITAL_COMMUNITY)
Admission: EM | Admit: 2016-07-11 | Discharge: 2016-07-11 | Disposition: A | Discharge status: Home / Self Care | Payer: Self-pay | Attending: Emergency Medicine | Admitting: Emergency Medicine

## 2016-07-11 ENCOUNTER — Emergency Department (HOSPITAL_COMMUNITY): Payer: Self-pay

## 2016-07-11 ENCOUNTER — Encounter (HOSPITAL_COMMUNITY): Payer: Self-pay | Admitting: Emergency Medicine

## 2016-07-11 DIAGNOSIS — J029 Acute pharyngitis, unspecified: Secondary | ICD-10-CM | POA: Insufficient documentation

## 2016-07-11 DIAGNOSIS — F17210 Nicotine dependence, cigarettes, uncomplicated: Secondary | ICD-10-CM | POA: Insufficient documentation

## 2016-07-11 DIAGNOSIS — R5383 Other fatigue: Secondary | ICD-10-CM | POA: Insufficient documentation

## 2016-07-11 DIAGNOSIS — R059 Cough, unspecified: Secondary | ICD-10-CM

## 2016-07-11 DIAGNOSIS — I10 Essential (primary) hypertension: Secondary | ICD-10-CM | POA: Insufficient documentation

## 2016-07-11 DIAGNOSIS — R0602 Shortness of breath: Secondary | ICD-10-CM | POA: Insufficient documentation

## 2016-07-11 DIAGNOSIS — J45909 Unspecified asthma, uncomplicated: Secondary | ICD-10-CM | POA: Insufficient documentation

## 2016-07-11 DIAGNOSIS — R05 Cough: Secondary | ICD-10-CM | POA: Insufficient documentation

## 2016-07-11 HISTORY — DX: Pneumonia, unspecified organism: J18.9

## 2016-07-11 HISTORY — DX: Sleep apnea, unspecified: G47.30

## 2016-07-11 MED ORDER — ALBUTEROL SULFATE HFA 108 (90 BASE) MCG/ACT IN AERS
2.0000 | INHALATION_SPRAY | Freq: Once | RESPIRATORY_TRACT | Status: AC
  Administered 2016-07-11: 2 via RESPIRATORY_TRACT
  Filled 2016-07-11: qty 6.7

## 2016-07-11 NOTE — ED Provider Notes (Signed)
[facility]-EMERGENCY DEPT Provider Note   CSN: [id number] Arrival date & time: 07/11/16  1356     History   Chief Complaint Chief Complaint  Patient presents with  . Cough    HPI Kelly SplinterJames [name] is a 40 y.o. male.  The history is provided by the patient.  Cough  This is a new problem. The current episode started 2 days ago. The problem occurs hourly. The problem has been gradually worsening. The cough is productive of sputum. There has been no fever. Associated symptoms include sore throat and shortness of breath. Associated symptoms comments: Chest wall pain . He is a smoker.  Patient with h/o asthma, empyema requiring VATS presents with cough for past 2-3 days No hemoptysis He reports right sided chest wall pain due to coughing He also reports SOB with cough No other symptoms reported   Past Medical History:  Diagnosis Date  . Asthma   . Pneumonia   . Sleep apnea     Patient Active Problem List   Diagnosis Date Noted  . Pleural effusion, right   . Status post thoracentesis   . Empyema (HCC)   . Acute respiratory failure with hypoxia and hypercapnia (HCC)   . Ileus, postoperative   . Essential hypertension   . Respiratory failure (HCC)   . Hypoxemia   . Endotracheally intubated   . Empyema lung (HCC)   . CAP (community acquired pneumonia) 02/22/2016  . ARF (acute respiratory failure) (HCC) 02/22/2016  . SOB (shortness of breath) 02/22/2016  . Chest pain 02/22/2016  . Pleural effusion on right 02/22/2016  . Respiratory disease     Past Surgical History:  Procedure Laterality Date  . VIDEO ASSISTED THORACOSCOPY (VATS)/EMPYEMA Right 02/24/2016   Procedure: VIDEO ASSISTED THORACOSCOPY Right (VATS) for [facility]/EMPYEMA, decortication of lung;  Surgeon: Kerin PernaPeter Van Trigt, MD;  [facility];  Service: Thoracic;  Laterality: Right;       Home Medications    Prior to Admission medications   Medication Sig Start Date End Date Taking? Authorizing Provider    cyclobenzaprine (FLEXERIL) 10 MG tablet Take 1 tablet (10 mg total) by mouth 2 (two) times daily as needed for muscle spasms. Patient not taking: Reported on 05/10/2016 02/19/16   Marily MemosJason Mesner, MD  ibuprofen (ADVIL,MOTRIN) 800 MG tablet Take 1 tablet (800 mg total) by mouth every 8 (eight) hours as needed for moderate pain. Patient not taking: Reported on 05/10/2016 02/19/16   Marily MemosJason Mesner, MD  oxyCODONE-acetaminophen (ROXICET) 5-325 MG tablet Take 1 tablet by mouth every 8 (eight) hours as needed for severe pain. 04/27/16   Delight OvensEdward B Gerhardt, MD    Family History No family history on file.  Social History Social History  Substance Use Topics  . Smoking status: Current Every Day Smoker    Packs/day: 0.50    Types: Cigarettes  . Smokeless tobacco: Never Used  . Alcohol use No     Comment: occ     Allergies   Review of patient's allergies indicates no known allergies.   Review of Systems Review of Systems  Constitutional: Positive for fatigue. Negative for fever.  HENT: Positive for sore throat.   Respiratory: Positive for cough and shortness of breath.   All other systems reviewed and are negative.    Physical Exam Updated Vital Signs BP 131/75   Pulse 80   Temp 98.1 F (36.7 C) (Oral)   Resp 20   Ht 6\' 1"  (1.854 m)   Wt 113.4 kg   SpO2  94%   BMI 32.98 kg/m   Physical Exam CONSTITUTIONAL: Well developed/well nourished HEAD: Normocephalic/atraumatic EYES: EOMI ENMT: Mucous membranes moist NECK: supple no meningeal signs SPINE/BACK:entire spine nontender CV: S1/S2 noted, no murmurs/rubs/gallops noted LUNGS: scattered coarse BS noted bilaterally ABDOMEN: soft, nontender GU:no cva tenderness NEURO: Pt is awake/alert/appropriate, moves all extremitiesx4.  No facial droop.   EXTREMITIES:  full ROM, no edema noted SKIN: warm, color normal PSYCH: no abnormalities of mood noted, alert and oriented to situation   ED Treatments / Results  Labs (all labs ordered are  listed, but only abnormal results are displayed) Labs Reviewed - No data to display  EKG  EKG Interpretation None       Radiology Dg Chest 2 View  Result Date: 07/11/2016 CLINICAL DATA:  Productive cough for 2 days.  Right side pain. EXAM: CHEST  2 VIEW COMPARISON:  05/10/2016 FINDINGS: Stable blunting of the right costophrenic angle, likely pleural scarring. Lungs are clear. No effusions. Heart is normal size. Mediastinal contours are within normal limits. No acute bony abnormality. IMPRESSION: Stable right basilar pleural thickening/ scarring. No active disease. Electronically Signed   By: Charlett Nose M.D.   On: 07/11/2016 14:36    Procedures Procedures (including critical care time)  Medications Ordered in ED Medications  albuterol (PROVENTIL HFA;VENTOLIN HFA) 108 (90 Base) MCG/ACT inhaler 2 puff (2 puffs Inhalation Given 07/11/16 1618)     Initial Impression / Assessment and Plan / ED Course  I have reviewed the triage vital signs and the nursing notes.  Pertinent imaging results that were available during my care of the patient were reviewed by me and considered in my medical decision making (see chart for details).  Clinical Course    Pt well appearing Previous h/o empyema but CXR negative Advised to quit smoking He was given albuterol mdi Appropriate for d/c home   Final Clinical Impressions(s) / ED Diagnoses   Final diagnoses:  Cough    New Prescriptions Discharge Medication List as of 07/11/2016  4:09 PM       [name] Rhine, MD 07/11/16 1717

## 2016-07-11 NOTE — ED Triage Notes (Signed)
Pt reports productive cough and rib cage soreness x 2 days. Pt states that he had surgery RT lung in March for pneumonia. Pt denies any fevers at this time.

## 2016-09-05 LAB — BLOOD GAS, ARTERIAL
ACID-BASE EXCESS: 1.5 mmol/L (ref 0.0–2.0)
Bicarbonate: 25.1 mEq/L — ABNORMAL HIGH (ref 20.0–24.0)
Mode: 4
O2 CONTENT: 87.4 L/min
O2 SAT: 87.4 %
PATIENT TEMPERATURE: 37
PCO2 ART: 45.7 mmHg — AB (ref 35.0–45.0)
PO2 ART: 57 mmHg — AB (ref 80.0–100.0)
pH, Arterial: 7.376 (ref 7.350–7.450)

## 2017-02-11 ENCOUNTER — Encounter (HOSPITAL_COMMUNITY): Payer: Self-pay | Admitting: *Deleted

## 2017-02-11 ENCOUNTER — Emergency Department (HOSPITAL_COMMUNITY)
Admission: EM | Admit: 2017-02-11 | Discharge: 2017-02-11 | Disposition: A | Discharge status: Home / Self Care | Payer: Self-pay | Attending: Emergency Medicine | Admitting: Emergency Medicine

## 2017-02-11 ENCOUNTER — Emergency Department (HOSPITAL_COMMUNITY): Payer: Self-pay

## 2017-02-11 DIAGNOSIS — J441 Chronic obstructive pulmonary disease with (acute) exacerbation: Secondary | ICD-10-CM | POA: Insufficient documentation

## 2017-02-11 DIAGNOSIS — Z79899 Other long term (current) drug therapy: Secondary | ICD-10-CM | POA: Insufficient documentation

## 2017-02-11 DIAGNOSIS — F17210 Nicotine dependence, cigarettes, uncomplicated: Secondary | ICD-10-CM | POA: Insufficient documentation

## 2017-02-11 DIAGNOSIS — J45909 Unspecified asthma, uncomplicated: Secondary | ICD-10-CM | POA: Insufficient documentation

## 2017-02-11 MED ORDER — PREDNISONE 10 MG PO TABS: 20.0000 mg | ORAL_TABLET | Freq: Every day | ORAL | 0 refills | Status: DC

## 2017-02-11 MED ORDER — ALBUTEROL SULFATE (2.5 MG/3ML) 0.083% IN NEBU
2.5000 mg | INHALATION_SOLUTION | Freq: Once | RESPIRATORY_TRACT | Status: AC
  Administered 2017-02-11: 2.5 mg via RESPIRATORY_TRACT
  Filled 2017-02-11: qty 3

## 2017-02-11 MED ORDER — ALBUTEROL SULFATE HFA 108 (90 BASE) MCG/ACT IN AERS
2.0000 | INHALATION_SPRAY | RESPIRATORY_TRACT | Status: DC | PRN
  Filled 2017-02-11: qty 6.7

## 2017-02-11 MED ORDER — PREDNISONE 50 MG PO TABS
60.0000 mg | ORAL_TABLET | Freq: Once | ORAL | Status: AC
  Administered 2017-02-11: 60 mg via ORAL
  Filled 2017-02-11: qty 1

## 2017-02-11 MED ORDER — AZITHROMYCIN 250 MG PO TABS: ORAL_TABLET | ORAL | 0 refills | Status: DC

## 2017-02-11 MED ORDER — OXYCODONE-ACETAMINOPHEN 5-325 MG PO TABS
1.0000 | ORAL_TABLET | Freq: Once | ORAL | Status: AC
  Administered 2017-02-11: 1 via ORAL
  Filled 2017-02-11: qty 1

## 2017-02-11 MED ORDER — IPRATROPIUM-ALBUTEROL 0.5-2.5 (3) MG/3ML IN SOLN
3.0000 mL | Freq: Once | RESPIRATORY_TRACT | Status: AC
  Administered 2017-02-11: 3 mL via RESPIRATORY_TRACT
  Filled 2017-02-11: qty 3

## 2017-02-11 MED ORDER — TRAMADOL HCL 50 MG PO TABS: 50.0000 mg | ORAL_TABLET | Freq: Four times a day (QID) | ORAL | 0 refills | Status: DC | PRN

## 2017-02-11 NOTE — Discharge Instructions (Signed)
Follow up if not improving

## 2017-02-11 NOTE — ED Provider Notes (Signed)
[facility] DEPT Provider Note   CSN: [id number] Arrival date & time: 02/11/17  1615     History   Chief Complaint Chief Complaint  Patient presents with  . Shortness of Breath    HPI [name] Snow is a 41 y.o. male.  Patient complains of cough shortness of breath wheezing   The history is provided by the patient. No language interpreter was used.  Shortness of Breath  This is a new problem. The problem occurs frequently.The current episode started more than 2 days ago. The problem has not changed since onset.Pertinent negatives include no fever, no headaches, no cough, no chest pain, no abdominal pain and no rash. The problem's precipitants include smoke.    Past Medical History:  Diagnosis Date  . Asthma   . Pneumonia   . Sleep apnea     Patient Active Problem List   Diagnosis Date Noted  . Pleural effusion, right   . Status post thoracentesis   . Empyema (HCC)   . Acute respiratory failure with hypoxia and hypercapnia (HCC)   . Ileus, postoperative (HCC)   . Essential hypertension   . Respiratory failure (HCC)   . Hypoxemia   . Endotracheally intubated   . Empyema lung (HCC)   . CAP (community acquired pneumonia) 02/22/2016  . ARF (acute respiratory failure) (HCC) 02/22/2016  . SOB (shortness of breath) 02/22/2016  . Chest pain 02/22/2016  . Pleural effusion on right 02/22/2016  . Respiratory disease     Past Surgical History:  Procedure Laterality Date  . VIDEO ASSISTED THORACOSCOPY (VATS)/EMPYEMA Right 02/24/2016   Procedure: VIDEO ASSISTED THORACOSCOPY Right (VATS) for [facility], decortication of lung;  Surgeon: Kerin Perna, MD;  Location: [facility] OR;  Service: Thoracic;  Laterality: Right;       Home Medications    Prior to Admission medications   Medication Sig Start Date End Date Taking? Authorizing Provider  azithromycin (ZITHROMAX Z-PAK) 250 MG tablet 2 po day one, then 1 daily x 4 days 02/11/17   Bethann Berkshire, MD  cyclobenzaprine  (FLEXERIL) 10 MG tablet Take 1 tablet (10 mg total) by mouth 2 (two) times daily as needed for muscle spasms. Patient not taking: Reported on 05/10/2016 02/19/16   Marily Memos, MD  ibuprofen (ADVIL,MOTRIN) 800 MG tablet Take 1 tablet (800 mg total) by mouth every 8 (eight) hours as needed for moderate pain. Patient not taking: Reported on 05/10/2016 02/19/16   Marily Memos, MD  oxyCODONE-acetaminophen (ROXICET) 5-325 MG tablet Take 1 tablet by mouth every 8 (eight) hours as needed for severe pain. 04/27/16   Delight Ovens, MD  predniSONE (DELTASONE) 10 MG tablet Take 2 tablets (20 mg total) by mouth daily. 02/11/17   Bethann Berkshire, MD  traMADol (ULTRAM) 50 MG tablet Take 1 tablet (50 mg total) by mouth every 6 (six) hours as needed. 02/11/17   Bethann Berkshire, MD    Family History History reviewed. No pertinent family history.  Social History Social History  Substance Use Topics  . Smoking status: Current Every Day Smoker    Packs/day: 0.50    Types: Cigarettes  . Smokeless tobacco: Never Used  . Alcohol use No     Comment: occ     Allergies   Patient has no known allergies.   Review of Systems Review of Systems  Constitutional: Negative for appetite change, fatigue and fever.  HENT: Negative for congestion, ear discharge and sinus pressure.   Eyes: Negative for discharge.  Respiratory: Positive for shortness  of breath. Negative for cough.   Cardiovascular: Negative for chest pain.  Gastrointestinal: Negative for abdominal pain and diarrhea.  Genitourinary: Negative for frequency and hematuria.  Musculoskeletal: Negative for back pain.  Skin: Negative for rash.  Neurological: Negative for seizures and headaches.  Psychiatric/Behavioral: Negative for hallucinations.     Physical Exam Updated Vital Signs BP 120/60   Pulse 98   Temp 98.6 F (37 C) (Oral)   Resp 14   Ht  (1.854 m)   Wt 260 lb (117.9 kg)   SpO2 100%   BMI 34.30 kg/m   Physical Exam  Constitutional:  He is oriented to person, place, and time. He appears well-developed.  HENT:  Head: Normocephalic.  Eyes: Conjunctivae and EOM are normal. No scleral icterus.  Neck: Neck supple. No thyromegaly present.  Cardiovascular: Normal rate and regular rhythm.  Exam reveals no gallop and no friction rub.   No murmur heard. Pulmonary/Chest: No stridor. He has wheezes. He has no rales. He exhibits no tenderness.  Abdominal: He exhibits no distension. There is no tenderness. There is no rebound.  Musculoskeletal: Normal range of motion. He exhibits no edema.  Lymphadenopathy:    He has no cervical adenopathy.  Neurological: He is oriented to person, place, and time. He exhibits normal muscle tone. Coordination normal.  Skin: No rash noted. No erythema.  Psychiatric: He has a normal mood and affect. His behavior is normal.     ED Treatments / Results  Labs (all labs ordered are listed, but only abnormal results are displayed) Labs Reviewed - No data to display  EKG  EKG Interpretation None       Radiology Dg Chest 2 View  Result Date: 02/11/2017 CLINICAL DATA:  Cough x3 days EXAM: CHEST  2 VIEW COMPARISON:  07/11/2016 FINDINGS: Chronic blunting of the right costophrenic angle, likely reflecting pleural thickening. No definite pleural effusions. No pneumothorax. Lungs are clear. The heart is normal in size. Visualized osseous structures are within normal limits. IMPRESSION: No evidence of acute cardiopulmonary disease. Electronically Signed   By: Charline Bills M.D.   On: 02/11/2017 17:07    Procedures Procedures (including critical care time)  Medications Ordered in ED Medications  predniSONE (DELTASONE) tablet 60 mg (not administered)  albuterol (PROVENTIL HFA;VENTOLIN HFA) 108 (90 Base) MCG/ACT inhaler 2 puff (not administered)  oxyCODONE-acetaminophen (PERCOCET/ROXICET) 5-325 MG per tablet 1 tablet (1 tablet Oral Given 02/11/17 1718)  ipratropium-albuterol (DUONEB) 0.5-2.5 (3)  MG/3ML nebulizer solution 3 mL (3 mLs Nebulization Given 02/11/17 1658)  albuterol (PROVENTIL) (2.5 MG/3ML) 0.083% nebulizer solution 2.5 mg (2.5 mg Nebulization Given 02/11/17 1658)     Initial Impression / Assessment and Plan / ED Course  I have reviewed the triage vital signs and the nursing notes.  Pertinent labs & imaging results that were available during my care of the patient were reviewed by me and considered in my medical decision making (see chart for details).     Patient with bronchitis and bronchospasm. He will be treated with Z-Pak albuterol inhaler prednisone and Ultram  Final Clinical Impressions(s) / ED Diagnoses   Final diagnoses:  COPD exacerbation (HCC)    New Prescriptions New Prescriptions   AZITHROMYCIN (ZITHROMAX Z-PAK) 250 MG TABLET    2 po day one, then 1 daily x 4 days   PREDNISONE (DELTASONE) 10 MG TABLET    Take 2 tablets (20 mg total) by mouth daily.   TRAMADOL (ULTRAM) 50 MG TABLET    Take 1 tablet (50  mg total) by mouth every 6 (six) hours as needed.     Bethann Berkshire, MD 02/11/17 [phone]

## 2017-02-11 NOTE — ED Triage Notes (Signed)
Pt reports 6/10 pain to right side when coughing and shortness of breath.  Pt reports non productive cough x 2 days.  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

## 2017-02-11 NOTE — Progress Notes (Signed)
Given Albuterol inhaler along with an accu chamber to take home. No administration necessary at this time.

## 2017-05-06 IMAGING — CR DG CHEST 1V PORT
1 series · 1 of 1 positions shown · non-contrast
Comparison: 02/22/2016

CLINICAL DATA: Endotracheal placement.

EXAM:
PORTABLE CHEST 1 VIEW

[AP]
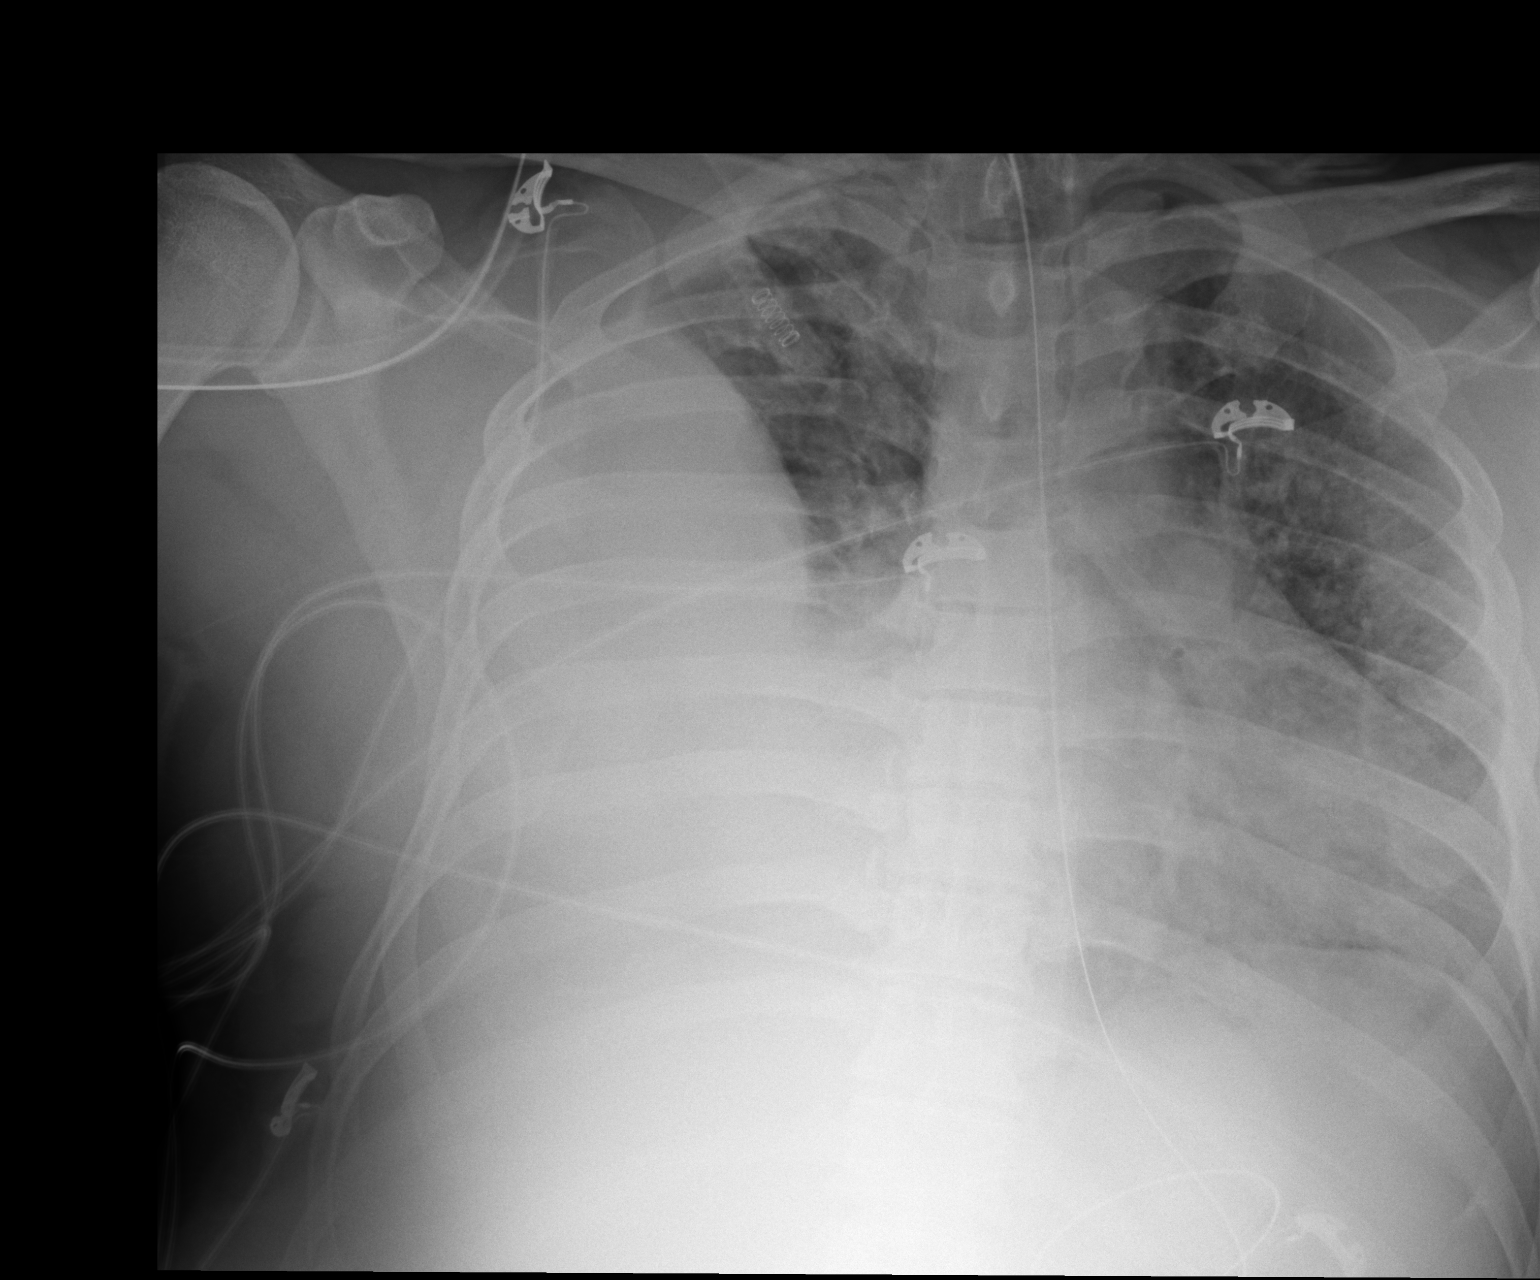

[1 of 1 positions shown; findings below may reference images not displayed]

FINDINGS: Endotracheal tube has its tip 8 cm above the carina. This could be
advanced. Nasogastric tube enters the abdomen. Large loculated
pleural fluid collection on the right with compressive atelectasis
of the right lung. Some aeration of the right upper lobe. Left lung
shows mild infiltrate or atelectasis at the base.
IMPRESSION: Endotracheal tube tip 8 cm above the carina.  Consider advancing.

Large loculated pleural fluid collection on the right with
compressive atelectasis of the right lung. Some aeration in the
right upper lung.

Mild atelectasis or infiltrate at the left base.

## 2017-05-07 IMAGING — CR DG CHEST 1V PORT
1 series · 1 of 1 positions shown · non-contrast
Comparison: Yesterday at 3219 hours

CLINICAL DATA: Endotracheal tube placement. Tube was advanced 4 cm.

EXAM:
PORTABLE CHEST 1 VIEW

[AP]
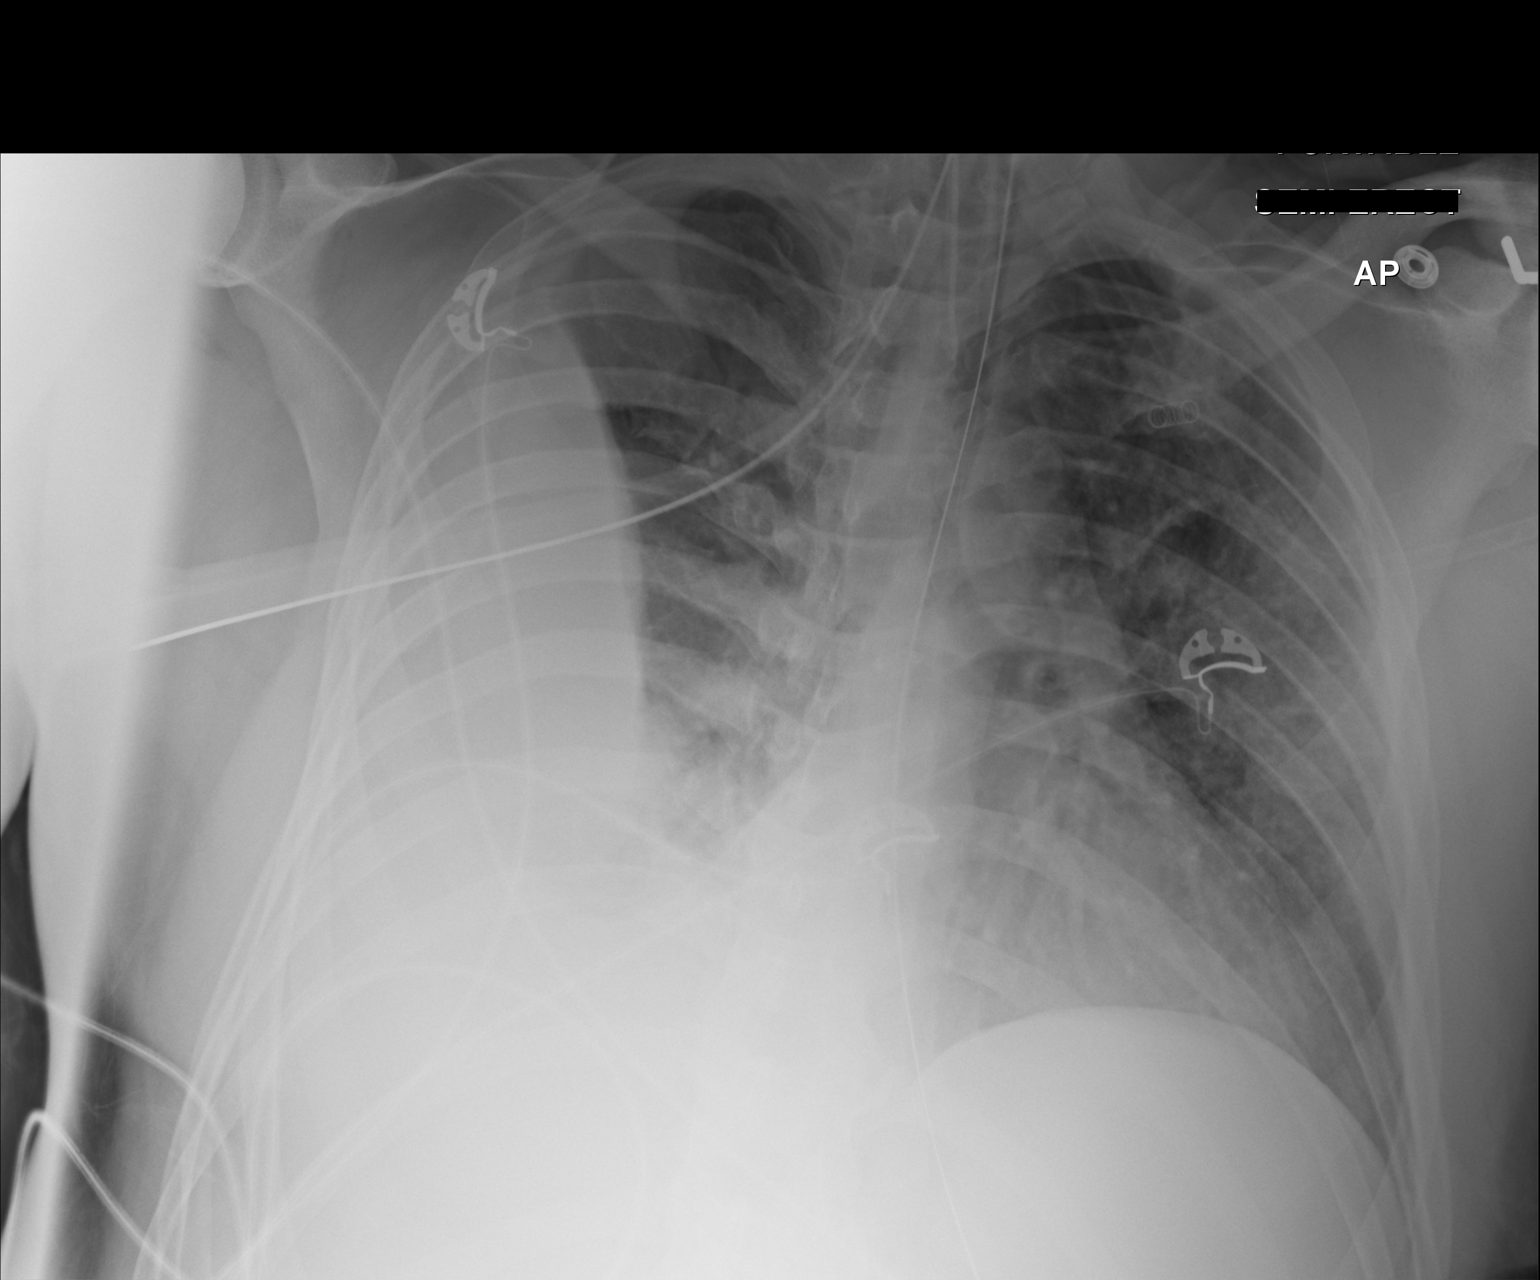

[1 of 1 positions shown; findings below may reference images not displayed]

FINDINGS: Endotracheal tube is been advanced, now 4.5 cm from the carina.
Enteric tube in place, tip below the diaphragm not included in the
field of view. Right lung opacity consistent with large partially
loculated pleural effusion is unchanged from prior exam.
Cardiomediastinal contours are unchanged. Left basilar atelectasis
is again seen, with mild improvement.
IMPRESSION: 1. Endotracheal tube no 4.5 cm from the carina, in appropriate
position.
2. Large partially loculated right pleural effusion is unchanged.

## 2017-05-08 IMAGING — CR DG CHEST 1V PORT
1 series · 1 of 1 positions shown · non-contrast
Comparison: Portable chest x-ray dated February 24, 2016

CLINICAL DATA: Follow-up empyema drainage on the right;
community-acquired pneumonia, acute respiratory failure, intubated
patient.

EXAM:
PORTABLE CHEST 1 VIEW

[AP]
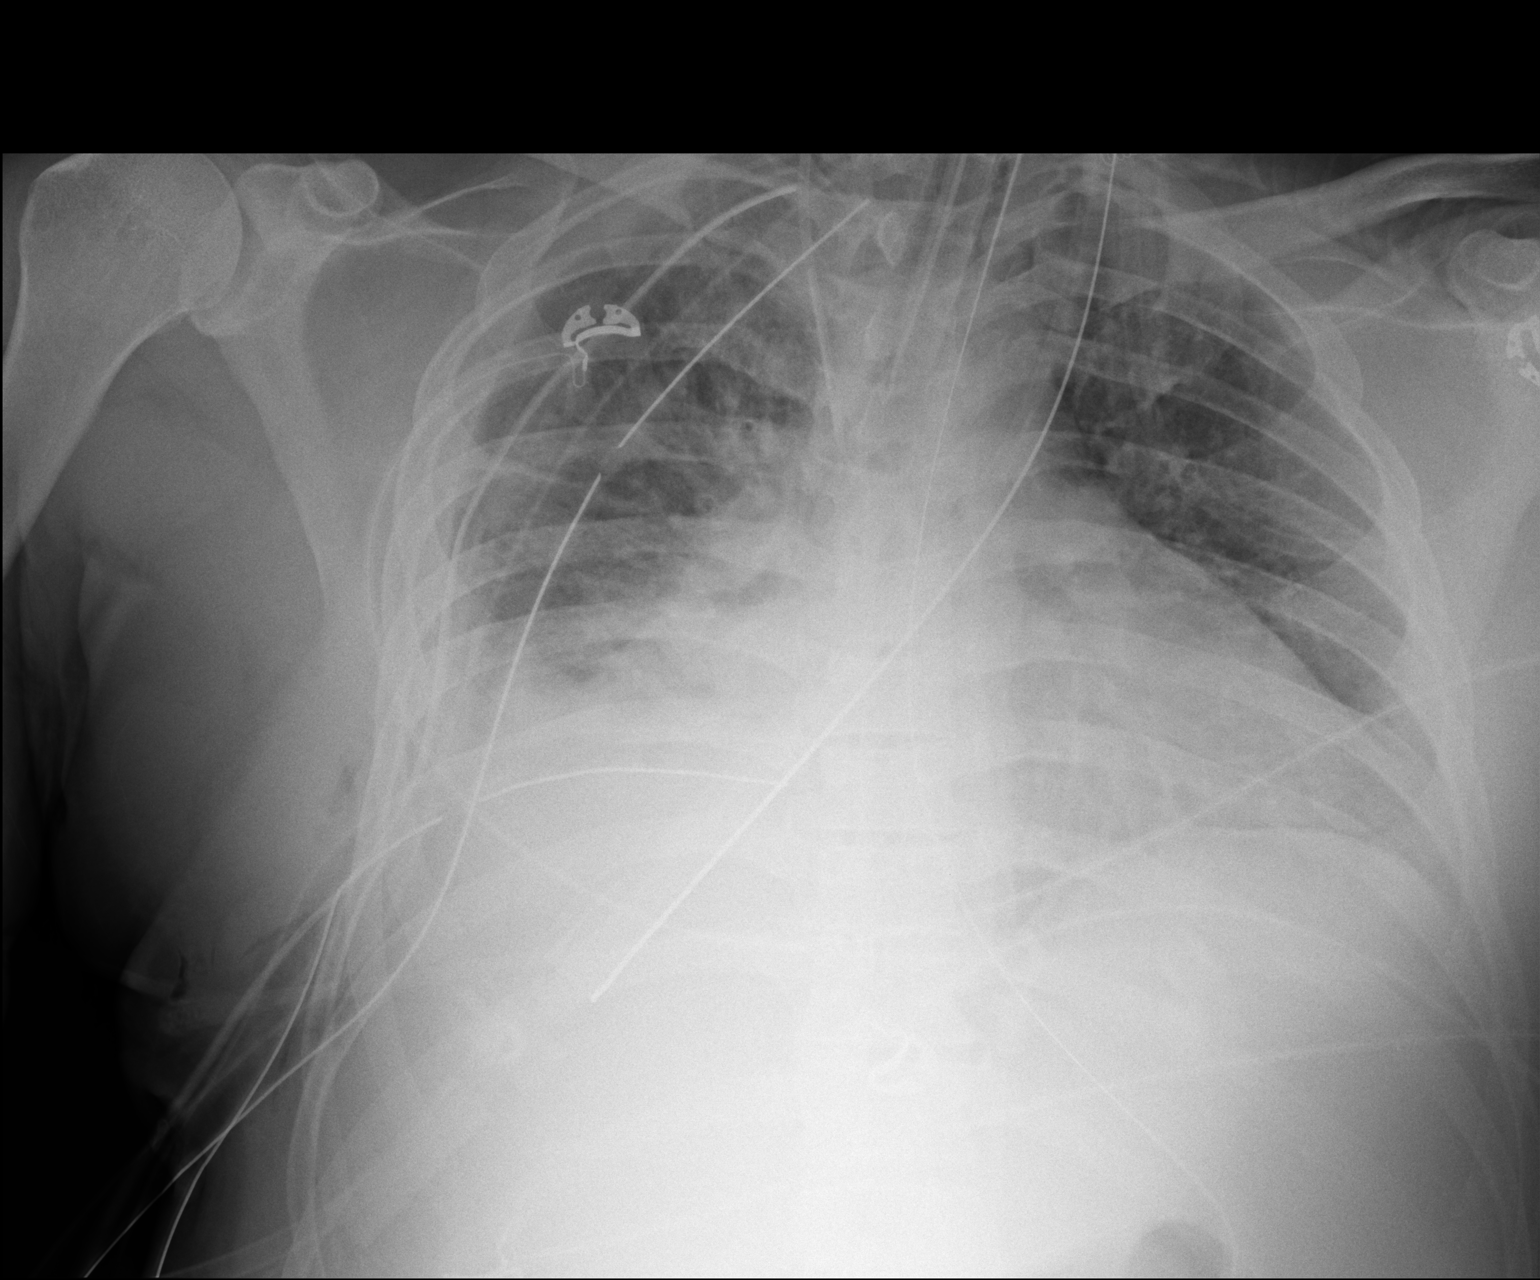

[1 of 1 positions shown; findings below may reference images not displayed]

FINDINGS: The lungs are slightly less well inflated today. On the right
somewhat increased density at the lung bases present accentuated by
hypo inflation. The 3 chest tubes are in stable position. On the
left there is no alveolar infiltrate. Minimal vascular crowding is
noted at the left lung base. The cardiac silhouette remains
enlarged. The pulmonary vascularity is engorged centrally. The
endotracheal tube tip lies 3.6 cm above the carina. The
esophagogastric tube tip projects below the inferior margin of the
image. The right internal jugular venous catheter tip projects over
the proximal SVC.
IMPRESSION: Mild bilateral hypo inflation accentuates the lung markings
bilaterally. There remains a small amount of pleural fluid and/or
pleural thickening on the right. No pneumothorax is evident. Stable
moderate CHF. The support tubes are in reasonable position.

## 2017-05-09 IMAGING — CR DG ABD PORTABLE 1V
2 series · 2 of 2 positions shown · non-contrast
Comparison: 02/26/2016

CLINICAL DATA: Abdominal pain, distension and gas s/p VATS on
02/24/2016.

EXAM:
PORTABLE ABDOMEN - 1 VIEW

[AP (1 of 2)]
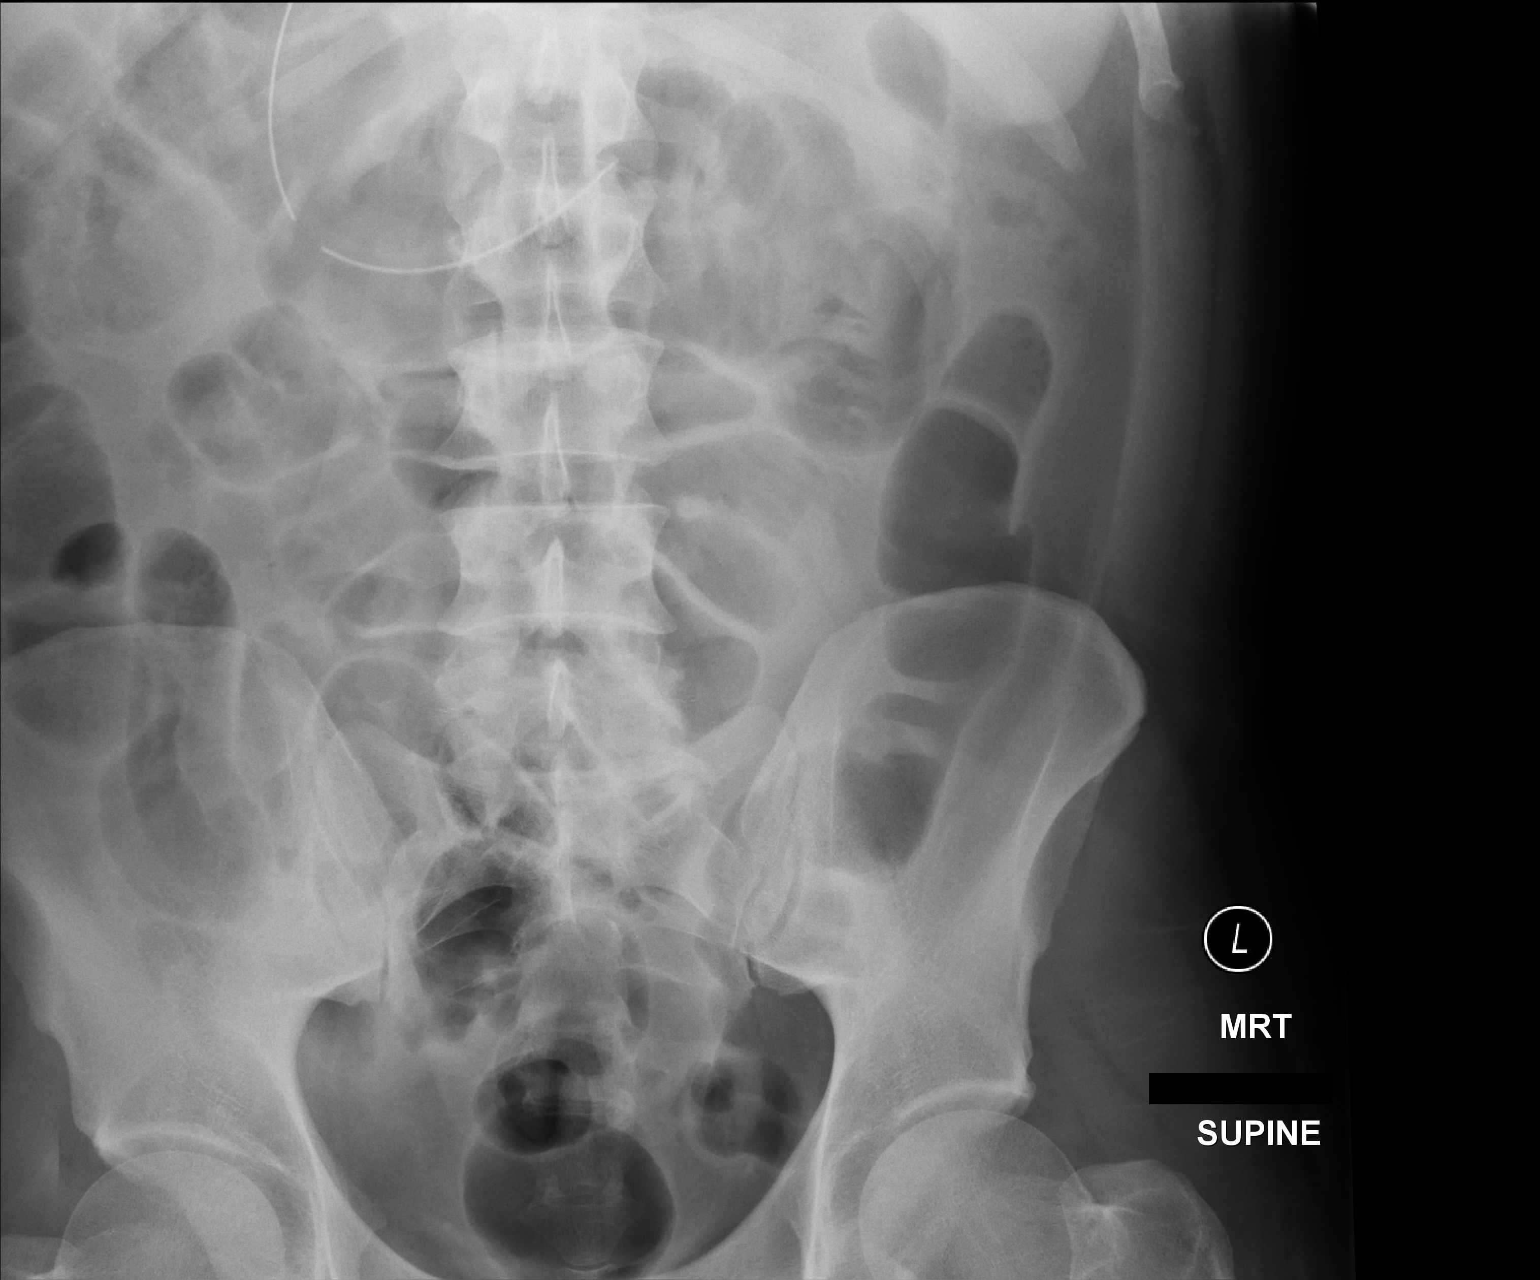

[AP (2 of 2)]
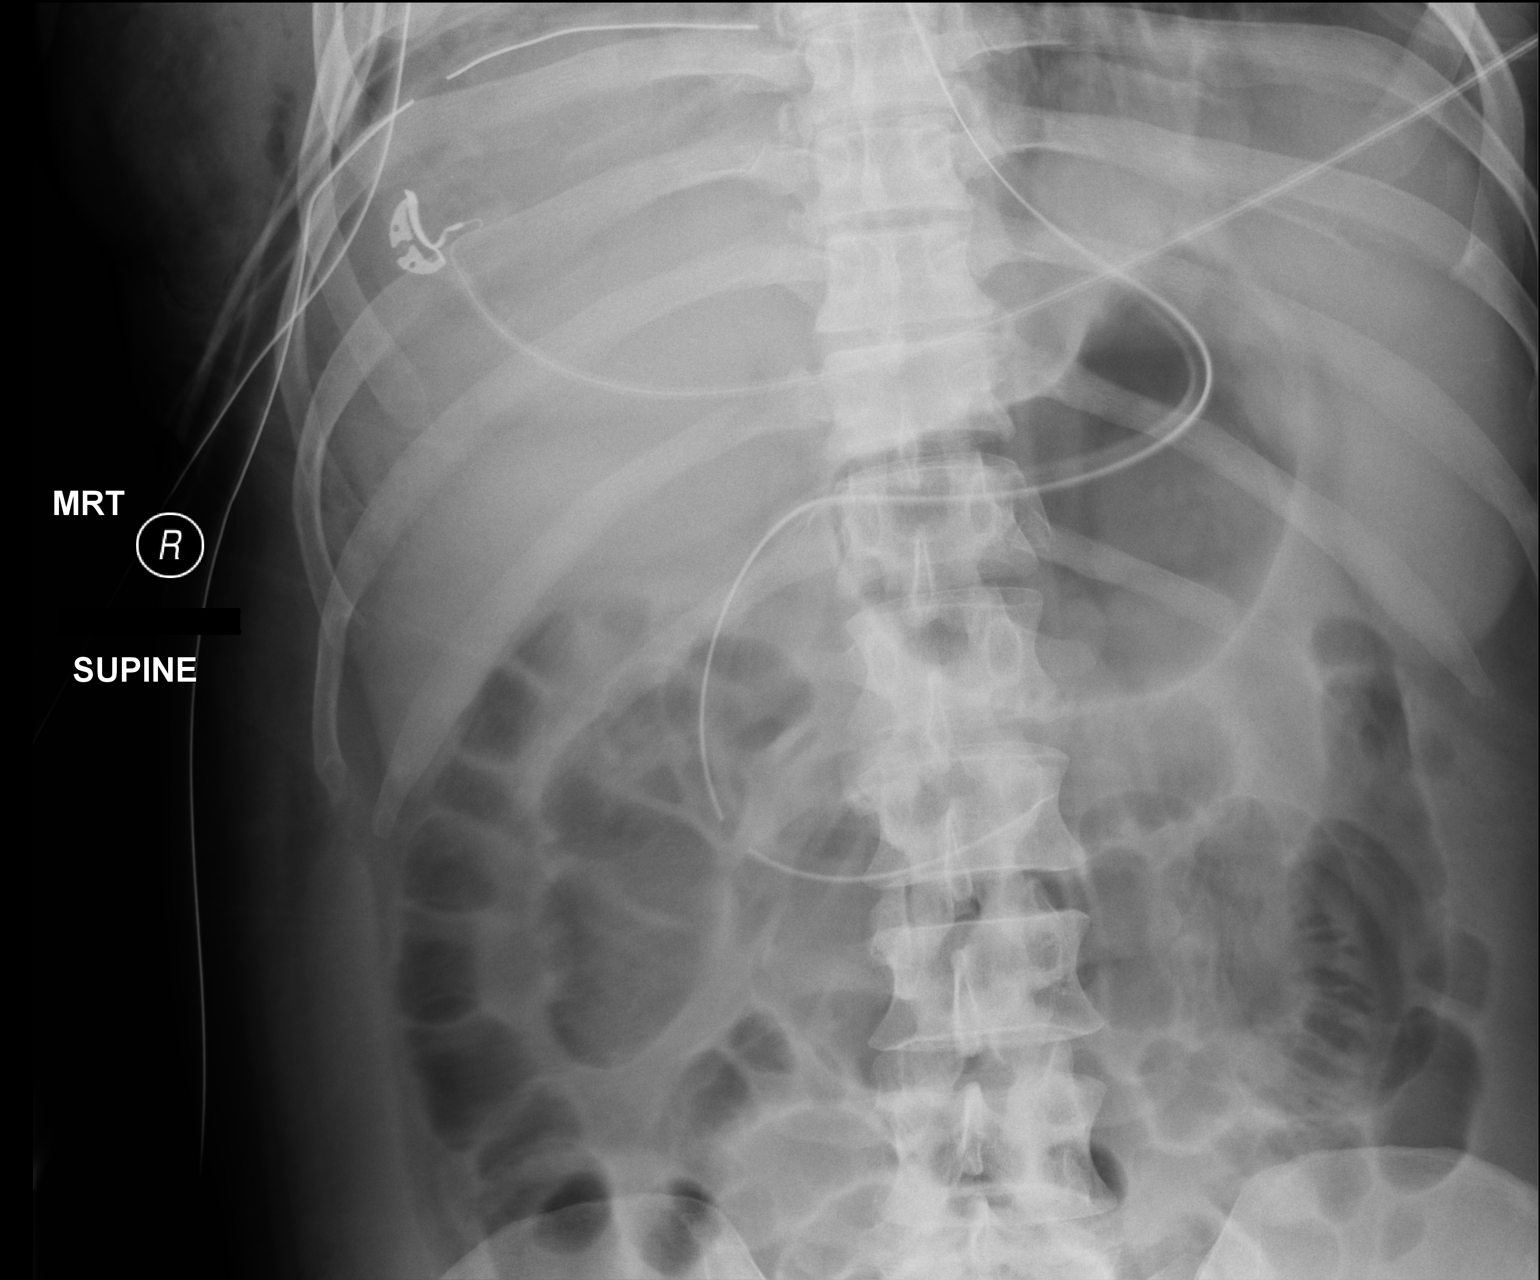

[2 of 2 positions shown; findings below may reference images not displayed]

FINDINGS: Nasogastric tube is in place, tip overlying the level of the third
portion of the duodenum. There is mild dilatation of central small
bowel loops. Gas is identified within nondilated loops of large
bowel. No evidence for free intraperitoneal air. Right-sided chest
tube is identified at the lung base.
IMPRESSION: Nasogastric tube tip overlying the level of the third portion of
duodenum.

Mildly dilated central small bowel loops.

## 2018-04-08 ENCOUNTER — Emergency Department (HOSPITAL_COMMUNITY)
Admission: EM | Admit: 2018-04-08 | Discharge: 2018-04-08 | Disposition: A | Discharge status: Home / Self Care | Payer: Self-pay | Attending: Emergency Medicine | Admitting: Emergency Medicine

## 2018-04-08 ENCOUNTER — Emergency Department (HOSPITAL_COMMUNITY): Payer: Self-pay

## 2018-04-08 ENCOUNTER — Other Ambulatory Visit: Payer: Self-pay

## 2018-04-08 ENCOUNTER — Encounter (HOSPITAL_COMMUNITY): Payer: Self-pay

## 2018-04-08 DIAGNOSIS — I10 Essential (primary) hypertension: Secondary | ICD-10-CM | POA: Insufficient documentation

## 2018-04-08 DIAGNOSIS — R1032 Left lower quadrant pain: Secondary | ICD-10-CM

## 2018-04-08 DIAGNOSIS — Y999 Unspecified external cause status: Secondary | ICD-10-CM | POA: Insufficient documentation

## 2018-04-08 DIAGNOSIS — Y939 Activity, unspecified: Secondary | ICD-10-CM | POA: Insufficient documentation

## 2018-04-08 DIAGNOSIS — M25552 Pain in left hip: Secondary | ICD-10-CM | POA: Insufficient documentation

## 2018-04-08 DIAGNOSIS — R0781 Pleurodynia: Secondary | ICD-10-CM

## 2018-04-08 DIAGNOSIS — F17210 Nicotine dependence, cigarettes, uncomplicated: Secondary | ICD-10-CM | POA: Insufficient documentation

## 2018-04-08 DIAGNOSIS — Y929 Unspecified place or not applicable: Secondary | ICD-10-CM | POA: Insufficient documentation

## 2018-04-08 DIAGNOSIS — J45909 Unspecified asthma, uncomplicated: Secondary | ICD-10-CM | POA: Insufficient documentation

## 2018-04-08 DIAGNOSIS — S30811A Abrasion of abdominal wall, initial encounter: Secondary | ICD-10-CM | POA: Insufficient documentation

## 2018-04-08 DIAGNOSIS — W309XXA Contact with unspecified agricultural machinery, initial encounter: Secondary | ICD-10-CM

## 2018-04-08 DIAGNOSIS — S20411A Abrasion of right back wall of thorax, initial encounter: Secondary | ICD-10-CM | POA: Insufficient documentation

## 2018-04-08 MED ORDER — METHOCARBAMOL 500 MG PO TABS: 500.0000 mg | ORAL_TABLET | Freq: Two times a day (BID) | ORAL | 0 refills | Status: DC

## 2018-04-08 MED ORDER — ACETAMINOPHEN 500 MG PO TABS
1000.0000 mg | ORAL_TABLET | Freq: Once | ORAL | Status: AC
  Administered 2018-04-08: 1000 mg via ORAL
  Filled 2018-04-08: qty 2

## 2018-04-08 MED ORDER — METHOCARBAMOL 500 MG PO TABS
500.0000 mg | ORAL_TABLET | Freq: Once | ORAL | Status: AC
  Administered 2018-04-08: 500 mg via ORAL
  Filled 2018-04-08: qty 1

## 2018-04-08 MED ORDER — KETOROLAC TROMETHAMINE 30 MG/ML IJ SOLN
15.0000 mg | Freq: Once | INTRAMUSCULAR | Status: AC
  Administered 2018-04-08: 15 mg via INTRAVENOUS
  Filled 2018-04-08: qty 1

## 2018-04-08 MED ORDER — NICOTINE 21 MG/24HR TD PT24
21.0000 mg | MEDICATED_PATCH | Freq: Once | TRANSDERMAL | Status: DC
  Administered 2018-04-08: 21 mg via TRANSDERMAL
  Filled 2018-04-08: qty 1
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
## 2018-04-08 NOTE — ED Notes (Signed)
Patient transported to CT 

## 2018-04-08 NOTE — ED Notes (Signed)
Patient transported to X-ray 

## 2018-04-08 NOTE — Discharge Instructions (Addendum)
Please take Ibuprofen and Tylenol for pain Take Robaxin for muscle pain and spasms Follow up with your doctor
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
## 2018-04-08 NOTE — ED Notes (Signed)
Patient verbalizes understanding of discharge instructions. Opportunity for questioning and answers were provided. Armband removed by staff, pt discharged from ED via wheelchair with family.  

## 2018-04-08 NOTE — ED Notes (Signed)
ED Provider at bedside. 

## 2021-04-05 DIAGNOSIS — G4733 Obstructive sleep apnea (adult) (pediatric): Secondary | ICD-10-CM | POA: Insufficient documentation

## 2021-04-05 DIAGNOSIS — F17210 Nicotine dependence, cigarettes, uncomplicated: Secondary | ICD-10-CM | POA: Insufficient documentation

## 2024-09-08 ENCOUNTER — Ambulatory Visit: Payer: Self-pay

## 2024-09-08 NOTE — Telephone Encounter (Signed)
 FYI Only or Action Required?: FYI only for provider: appointment scheduled on 09/15/24.  Patient was last seen in primary care on New Patient.  Called Nurse Triage reporting Hyperglycemia.  Symptoms began several months ago.  Interventions attempted: Rest, hydration, or home remedies.  Symptoms are: gradually worsening.  Triage Disposition: See Physician Within 24 Hours  Patient/caregiver understands and will follow disposition?: Yes, declines appt today due to prior obligation, scheduled and education provided on when to seek emergent care.      Copied from CRM [id number]. Topic: Clinical - Red Word Triage >> Sep 08, 2024  9:28 AM Diannia H wrote: [facility] that prompted transfer to Nurse Triage: Patients wife called in today because the patients sugar is so high, it has been running up as high as 400, the patient is experiencing numbness in his toes, he knobs at at the blink of an eye. They have cut back on his sodas, carbs and so much more and nothing is helping. It was 300 when she checked it at 5:30 this morning. The highest its ever been was 500. Reason for Disposition  New-onset diabetes suspected (e.g., increased thirst, frequent urination, weight loss)  Answer Assessment - Initial Assessment Questions Pt is currently not experiencing any emergent symptoms, education provided to wife about when to seek emergency care prior to appt if symptoms develop.  1. BLOOD GLUCOSE: What is your blood glucose level?      200-300 daily 2. ONSET: When did you check the blood glucose?     5 months pt has been experiencing elevated BG readings 3. USUAL RANGE: What is your glucose level usually? (e.g., usual fasting morning value, usual evening value)     Lowest reading in last few months has been 181 5. TYPE 1 or 2:  Do you know what type of diabetes you have?  (e.g., Type 1, Type 2, Gestational; doesn't know)      Pt has not been diagnosed with diabetes  8. OTHER SYMPTOMS: Do you  have any symptoms? (e.g., fever, frequent urination, difficulty breathing, dizziness, weakness, vomiting)     Frequent urination, lethargy, vomiting, abd pain  Protocols used: Diabetes - High Blood Sugar-A-AH

## 2024-09-11 DIAGNOSIS — Z0001 Encounter for general adult medical examination with abnormal findings: Secondary | ICD-10-CM | POA: Insufficient documentation

## 2024-09-11 NOTE — Progress Notes (Signed)
 Subjective:  Patient ID: [name] Snow, male    DOB: [birth year], 48 y.o.   MRN: [id number]  Patient Care Team: Deitra Morton Sebastian Nena, NP as PCP - General (Nurse Practitioner)   Chief Complaint:  Establish Care   HPI: [name] Snow is a 48 y.o. male presenting on 09/15/2024 for Establish Care   Discussed the use of AI scribe software for clinical note transcription with the patient, who gave verbal consent to proceed.  History of Present Illness [name] Snow is a 48 year old male who presents with concerns of hyperglycemia and possible undiagnosed diabetes.  He has been experiencing elevated blood glucose levels for several months, with fasting levels reaching up to 356 mg/dL. His partner has been monitoring his blood glucose at home, noting values ranging from 134 mg/dL to 643 mg/dL over the past week. He has not been formally diagnosed with diabetes and is not currently on any medication for hyperglycemia. He experiences lethargy, difficulty waking up, and frequent nodding off, which have been ongoing for several months. His partner notes that he becomes very lethargic and sometimes difficult to wake. He also experiences frequent urination, with dark and strong-smelling urine, especially in the morning.  He has a history of sleep apnea, untreated since a CPAP was used during a hospital stay in 2017. 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  His partner has made dietary changes to reduce carbohydrate intake, focusing on protein and low-carb options, but he remains hungry and is resistant to some dietary changes. He drinks unsweetened tea and water with lemon, avoiding sugary drinks.  He has a history of hypertension and hyperlipidemia. He also has a history of hypoxia, chest pain, and lumbar radiculopathy.  He smokes and uses smokeless tobacco, sharing a pack of  cigarettes daily with his partner and using pouches of smokeless tobacco, which he started to quit smoking. He works as a tree surgeon and is concerned about the safety implications of his symptoms on his job. He and his partner are trying to quit smoking and reduce tobacco use.       09/15/2024   10:26 AM 03/09/2016    2:48 PM  PHQ9 SCORE ONLY  PHQ-9 Total Score 0 3      Data saved with a previous flowsheet row definition       09/15/2024   10:56 AM  GAD 7 : Generalized Anxiety Score  Nervous, Anxious, on Edge 0  Control/stop worrying 0  Worry too much - different things 0  Trouble relaxing 0  Restless 0  Easily annoyed or irritable 0  Afraid - awful might happen 0  Total GAD 7 Score 0  Anxiety Difficulty Not difficult at all       Relevant past medical, surgical, family, and social history reviewed and updated as indicated.  Allergies and medications reviewed and updated. Data reviewed: Chart in Epic.   Past Medical History:  Diagnosis Date   Asthma    Pneumonia    Sleep apnea     Past Surgical History:  Procedure Laterality Date   VIDEO ASSISTED THORACOSCOPY (VATS)/EMPYEMA Right 02/24/2016   Procedure: VIDEO ASSISTED THORACOSCOPY Right (VATS) for [facility], decortication of lung;  Surgeon: Maude Fleeta Ochoa, MD;  Location: [facility] OR;  Service: Thoracic;  Laterality: Right;    Social History   Socioeconomic History   Marital status: Married    Spouse name: Harlene  Number of children: 2   Years of education: Not on file   Highest education level: Not on file  Occupational History   Occupation: Heavy arboriculturist  Tobacco Use   Smoking status: Every Day    Current packs/day: 1.00    Types: Cigarettes   Smokeless tobacco: Never  Vaping Use   Vaping status: Never Used  Substance and Sexual Activity   Alcohol use: No    Comment: occ   Drug use: No   Sexual activity: Not on file  Other Topics Concern   Not on file  Social History Narrative   Not on  file   Social Drivers of Health   Financial Resource Strain: Not on file  Food Insecurity: Not on file  Transportation Needs: Not on file  Physical Activity: Not on file  Stress: Not on file  Social Connections: Not on file  Intimate Partner Violence: Not on file    Outpatient Encounter Medications as of 09/15/2024  Medication Sig   Blood Glucose Monitoring Suppl DEVI 1 each by Does not apply route as directed. Dispense based on patient and insurance preference. Use up to four times daily as directed. (FOR ICD-10 E10.9, E11.9).   cetirizine (ZYRTEC ALLERGY) 10 MG tablet Take 1 tablet (10 mg total) by mouth daily.   Lancet Device MISC 1 each by Does not apply route as directed. Use once daily as directed.   lisinopril (ZESTRIL) 10 MG tablet Take 1 tablet (10 mg total) by mouth daily.   metFORMIN (GLUCOPHAGE) 1000 MG tablet Take 1 tablet (1,000 mg total) by mouth 2 (two) times daily with a meal.   [DISCONTINUED] methocarbamol  (ROBAXIN ) 500 MG tablet Take 1 tablet (500 mg total) by mouth 2 (two) times daily.   No facility-administered encounter medications on file as of 09/15/2024.    No Known Allergies  Review of Systems  Constitutional:  Negative for chills and fever.  HENT:  Positive for congestion and ear discharge.   Respiratory:  Negative for shortness of breath.   Cardiovascular:  Negative for chest pain and leg swelling.  Gastrointestinal:  Negative for abdominal pain, blood in stool, constipation, diarrhea, nausea and vomiting.  Musculoskeletal:  Negative for falls.  Skin:  Negative for itching and rash.  Neurological:  Negative for dizziness and headaches.  Endo/Heme/Allergies:  Positive for polydipsia.  Psychiatric/Behavioral:  Negative for suicidal ideas. The patient is not nervous/anxious.          Objective:  BP 128/82   Pulse 89   Temp 97.9 F (36.6 C)   Ht 6' 2 (1.88 m)   Wt 251 lb 3.2 oz (113.9 kg)   SpO2 96%   BMI 32.25 kg/m    Wt Readings from  Last 3 Encounters:  09/15/24 251 lb 3.2 oz (113.9 kg)  04/08/18 240 lb (108.9 kg)  02/11/17 260 lb (117.9 kg)   BP Readings from Last 3 Encounters:  09/15/24 128/82  04/08/18 133/75  02/11/17 117/84     Physical Exam Vitals and nursing note reviewed.  Constitutional:      Appearance: He is obese.  HENT:     Head: Normocephalic and atraumatic.     Right Ear: Tympanic membrane, ear canal and external ear normal. There is no impacted cerumen.     Left Ear: Tympanic membrane, ear canal and external ear normal. There is no impacted cerumen.     Nose: Nose normal.     Mouth/Throat:     Mouth: Mucous membranes are moist.  Eyes:     General: No scleral icterus.    Extraocular Movements: Extraocular movements intact.     Conjunctiva/sclera: Conjunctivae normal.     Pupils: Pupils are equal, round, and reactive to light.  Cardiovascular:     Heart sounds: Normal heart sounds.  Pulmonary:     Effort: Pulmonary effort is normal.     Breath sounds: Normal breath sounds.  Abdominal:     General: Bowel sounds are normal.     Palpations: Abdomen is soft.  Musculoskeletal:     Right lower leg: No edema.     Left lower leg: No edema.  Skin:    General: Skin is warm and dry.     Findings: No rash.  Neurological:     Mental Status: He is alert and oriented to person, place, and time.  Psychiatric:        Mood and Affect: Mood normal.        Thought Content: Thought content normal.        Judgment: Judgment normal.    Physical Exam VITALS: BP- 138/82     Results for orders placed or performed during the hospital encounter of 02/21/16  Brain natriuretic peptide   Collection Time: 02/22/16 12:42 AM  Result Value Ref Range   B Natriuretic Peptide 30.0 0.0 - 100.0 pg/mL  Comprehensive metabolic panel   Collection Time: 02/22/16 12:46 AM  Result Value Ref Range   Sodium 135 135 - 145 mmol/L   Potassium 3.9 3.5 - 5.1 mmol/L   Chloride 104 101 - 111 mmol/L   CO2 22 22 - 32 mmol/L    Glucose, Bld 113 (H) 65 - 99 mg/dL   BUN 17 6 - 20 mg/dL   Creatinine, Ser 8.94 0.61 - 1.24 mg/dL   Calcium 8.0 (L) 8.9 - 10.3 mg/dL   Total Protein 6.8 6.5 - 8.1 g/dL   Albumin 3.5 3.5 - 5.0 g/dL   AST 19 15 - 41 U/L   ALT 41 17 - 63 U/L   Alkaline Phosphatase 57 38 - 126 U/L   Total Bilirubin 2.2 (H) 0.3 - 1.2 mg/dL   GFR calc non Af Amer >60 >60 mL/min   GFR calc Af Amer >60 >60 mL/min   Anion gap 9 5 - 15  CBC with Differential   Collection Time: 02/22/16 12:46 AM  Result Value Ref Range   WBC 11.2 (H) 4.0 - 10.5 K/uL   RBC 4.29 4.22 - 5.81 MIL/uL   Hemoglobin 13.2 13.0 - 17.0 g/dL   HCT 62.2 (L) 60.9 - 47.9 %   MCV 87.9 78.0 - 100.0 fL   MCH 30.8 26.0 - 34.0 pg   MCHC 35.0 30.0 - 36.0 g/dL   RDW 85.5 88.4 - 84.4 %   Platelets 140 (L) 150 - 400 K/uL   Neutrophils Relative % 83 %   Neutro Abs 9.2 (H) 1.7 - 7.7 K/uL   Lymphocytes Relative 9 %   Lymphs Abs 1.1 0.7 - 4.0 K/uL   Monocytes Relative 8 %   Monocytes Absolute 0.9 0.1 - 1.0 K/uL   Eosinophils Relative 0 %   Eosinophils Absolute 0.1 0.0 - 0.7 K/uL   Basophils Relative 0 %   Basophils Absolute 0.0 0.0 - 0.1 K/uL  Troponin I   Collection Time: 02/22/16 12:46 AM  Result Value Ref Range   Troponin I <0.03 <0.031 ng/mL  Lactic acid, plasma   Collection Time: 02/22/16  2:31 AM  Result Value Ref Range  Lactic Acid, Venous 0.8 0.5 - 2.0 mmol/L  HIV antibody   Collection Time: 02/22/16  2:31 AM  Result Value Ref Range   HIV Screen 4th Generation wRfx Non Reactive Non Reactive  Procalcitonin - Baseline   Collection Time: 02/22/16  2:31 AM  Result Value Ref Range   Procalcitonin 0.53 ng/mL  Blood gas, arterial   Collection Time: 02/22/16  2:35 AM  Result Value Ref Range   FIO2 0.21    pH, Arterial 7.419 7.350 - 7.450   pCO2 arterial 34.3 (L) 35.0 - 45.0 mmHg   pO2, Arterial 59.8 (L) 80.0 - 100.0 mmHg   Bicarbonate 22.9 20.0 - 24.0 mEq/L   Acid-base deficit 2.0 0.0 - 2.0 mmol/L   O2 Saturation 90.3 %    Patient temperature 37.0    Allens test (pass/fail) POSITIVE (A) PASS  Culture, blood (Routine X 2) w Reflex to ID Panel   Collection Time: 02/22/16  3:00 AM   Specimen: BLOOD  Result Value Ref Range   Specimen Description BLOOD RIGHT ANTECUBITAL    Special Requests      BOTTLES DRAWN AEROBIC AND ANAEROBIC AEB 10CC ANA 6CC   Culture NO GROWTH 6 DAYS    Report Status 02/28/2016 FINAL   MRSA PCR Screening   Collection Time: 02/22/16  3:25 AM   Specimen: Nasopharyngeal  Result Value Ref Range   MRSA by PCR NEGATIVE NEGATIVE  CBC WITH DIFFERENTIAL   Collection Time: 02/22/16  3:29 AM  Result Value Ref Range   WBC 11.1 (H) 4.0 - 10.5 K/uL   RBC 4.14 (L) 4.22 - 5.81 MIL/uL   Hemoglobin 12.6 (L) 13.0 - 17.0 g/dL   HCT 63.6 (L) 60.9 - 47.9 %   MCV 87.7 78.0 - 100.0 fL   MCH 30.4 26.0 - 34.0 pg   MCHC 34.7 30.0 - 36.0 g/dL   RDW 85.5 88.4 - 84.4 %   Platelets 143 (L) 150 - 400 K/uL   Neutrophils Relative % 83 %   Neutro Abs 9.3 (H) 1.7 - 7.7 K/uL   Lymphocytes Relative 9 %   Lymphs Abs 1.0 0.7 - 4.0 K/uL   Monocytes Relative 7 %   Monocytes Absolute 0.8 0.1 - 1.0 K/uL   Eosinophils Relative 1 %   Eosinophils Absolute 0.1 0.0 - 0.7 K/uL   Basophils Relative 0 %   Basophils Absolute 0.0 0.0 - 0.1 K/uL  Culture, blood (Routine X 2) w Reflex to ID Panel   Collection Time: 02/22/16  3:35 AM   Specimen: BLOOD LEFT HAND  Result Value Ref Range   Specimen Description BLOOD LEFT HAND    Special Requests      BOTTLES DRAWN AEROBIC AND ANAEROBIC AEB 12CC ANA 8CC   Culture NO GROWTH 6 DAYS    Report Status 02/28/2016 FINAL   TSH   Collection Time: 02/22/16  3:35 AM  Result Value Ref Range   TSH 1.875 0.350 - 4.500 uIU/mL  Comprehensive metabolic panel   Collection Time: 02/22/16  3:35 AM  Result Value Ref Range   Sodium 136 135 - 145 mmol/L   Potassium 3.7 3.5 - 5.1 mmol/L   Chloride 106 101 - 111 mmol/L   CO2 21 (L) 22 - 32 mmol/L   Glucose, Bld 123 (H) 65 - 99 mg/dL   BUN 17 6  - 20 mg/dL   Creatinine, Ser 8.98 0.61 - 1.24 mg/dL   Calcium 8.2 (L) 8.9 - 10.3 mg/dL   Total Protein 6.4 (L)  6.5 - 8.1 g/dL   Albumin 3.4 (L) 3.5 - 5.0 g/dL   AST 17 15 - 41 U/L   ALT 38 17 - 63 U/L   Alkaline Phosphatase 56 38 - 126 U/L   Total Bilirubin 1.6 (H) 0.3 - 1.2 mg/dL   GFR calc non Af Amer >60 >60 mL/min   GFR calc Af Amer >60 >60 mL/min   Anion gap 9 5 - 15  Strep pneumoniae urinary antigen   Collection Time: 02/22/16  4:00 AM  Result Value Ref Range   Strep Pneumo Urinary Antigen NEGATIVE NEGATIVE  Urinalysis, Routine w reflex microscopic (not at [facility])   Collection Time: 02/22/16  4:00 AM  Result Value Ref Range   Color, Urine YELLOW YELLOW   APPearance CLEAR CLEAR   Specific Gravity, Urine 1.010 1.005 - 1.030   pH 5.5 5.0 - 8.0   Glucose, UA NEGATIVE NEGATIVE mg/dL   Hgb urine dipstick NEGATIVE NEGATIVE   Bilirubin Urine NEGATIVE NEGATIVE   Ketones, ur NEGATIVE NEGATIVE mg/dL   Protein, ur NEGATIVE NEGATIVE mg/dL   Nitrite NEGATIVE NEGATIVE   Leukocytes, UA NEGATIVE NEGATIVE  Influenza panel by PCR (type A & B, H1N1)   Collection Time: 02/22/16  4:05 AM  Result Value Ref Range   Influenza A By PCR NEGATIVE NEGATIVE   Influenza B By PCR NEGATIVE NEGATIVE   H1N1 flu by pcr NOT DETECTED NOT DETECTED  Lactic acid, plasma   Collection Time: 02/22/16  5:28 AM  Result Value Ref Range   Lactic Acid, Venous 0.8 0.5 - 2.0 mmol/L  Culture, body fluid-bottle   Collection Time: 02/22/16  8:15 AM   Specimen: Lung  Result Value Ref Range   Specimen Description LUNG RIGHT    Special Requests NONE 10CC EACH    Culture NO GROWTH 6 DAYS    Report Status 02/28/2016 FINAL   Gram stain   Collection Time: 02/22/16  8:15 AM   Specimen: Fluid  Result Value Ref Range   Specimen Description FLUID RIGHT PLEURAL COLLECTED BY DOCTOR    Special Requests NONE    Gram Stain      NO ORGANISMS SEEN ABUNDANT WBC PRESENT, PREDOMINANTLY PMN Performed at [facility]     Report Status 03/01/2016 FINAL   Body fluid cell count with differential   Collection Time: 02/22/16  8:15 AM  Result Value Ref Range   Fluid Type-FCT PLEURAL    Color, Fluid YELLOW YELLOW   Appearance, Fluid HAZY' CLEAR   Total Nucleated Cell Count, Fluid 3,867 (H) 0 - 1,000 cu mm   Neutrophil Count, Fluid 94 (H) 0 - 25 %   Lymphs, Fluid 3 %   Monocyte-Macrophage-Serous Fluid 3 (L) 50 - 90 %   Eos, Fluid 0 %   Other Cells, Fluid 0 %  PH, Body Fluid   Collection Time: 02/22/16  8:15 AM  Result Value Ref Range   pH, Body Fluid 7.6 Not Estab.   Source of Sample PLEURAL   Legionella Pneumophila Serogp 1 Ur Ag   Collection Time: 02/22/16 10:01 AM  Result Value Ref Range   L. pneumophila Serogp 1 Ur Ag Negative Negative   Source of Sample URINE, CLEAN CATCH   Lactate dehydrogenase (CSF, pleural or peritoneal fluid)   Collection Time: 02/22/16 10:05 AM  Result Value Ref Range   LD, Fluid 686 (H) 3 - 23 U/L   Fluid Type-FLDH Pleural R   Protein, pleural or peritoneal fluid   Collection Time: 02/22/16  10:05 AM  Result Value Ref Range   Total protein, fluid 5.0 g/dL   Fluid Type-FTP Pleural R   Glucose, pleural or peritoneal fluid   Collection Time: 02/22/16 10:05 AM  Result Value Ref Range   Glucose, Fluid 67 mg/dL   Fluid Type-FGLU Pleural R   ECHO COMPLETE   Collection Time: 02/22/16  2:06 PM  Result Value Ref Range   Weight 3,795.44 oz   Height 73 in   BP 101/68 mmHg  Blood gas, arterial   Collection Time: 02/23/16 12:28 AM  Result Value Ref Range   O2 Content 4.0 L/min   Delivery systems NASAL CANNULA    pH, Arterial 7.380 7.350 - 7.450   pCO2 arterial 43.8 35.0 - 45.0 mmHg   pO2, Arterial 76.7 (L) 80.0 - 100.0 mmHg   Bicarbonate 24.8 (H) 20.0 - 24.0 mEq/L   Acid-Base Excess 0.8 0.0 - 2.0 mmol/L   O2 Saturation 94.8 %   Collection site LEFT RADIAL    Drawn by [id number]    Sample type ARTERIAL DRAW    Allens test (pass/fail) PASS PASS  CBC   Collection Time:  02/23/16  9:17 AM  Result Value Ref Range   WBC 9.5 4.0 - 10.5 K/uL   RBC 4.05 (L) 4.22 - 5.81 MIL/uL   Hemoglobin 12.2 (L) 13.0 - 17.0 g/dL   HCT 63.7 (L) 60.9 - 47.9 %   MCV 89.4 78.0 - 100.0 fL   MCH 30.1 26.0 - 34.0 pg   MCHC 33.7 30.0 - 36.0 g/dL   RDW 85.3 88.4 - 84.4 %   Platelets 155 150 - 400 K/uL  Basic metabolic panel   Collection Time: 02/23/16  9:17 AM  Result Value Ref Range   Sodium 136 135 - 145 mmol/L   Potassium 3.9 3.5 - 5.1 mmol/L   Chloride 103 101 - 111 mmol/L   CO2 26 22 - 32 mmol/L   Glucose, Bld 114 (H) 65 - 99 mg/dL   BUN 15 6 - 20 mg/dL   Creatinine, Ser 9.18 0.61 - 1.24 mg/dL   Calcium 8.1 (L) 8.9 - 10.3 mg/dL   GFR calc non Af Amer >60 >60 mL/min   GFR calc Af Amer >60 >60 mL/min   Anion gap 7 5 - 15  Blood gas, arterial   Collection Time: 02/23/16  9:25 AM  Result Value Ref Range   O2 Content 87.4 L/min   Delivery systems NASAL CANNULA    Mode 4    pH, Arterial 7.376 7.350 - 7.450   pCO2 arterial 45.7 (H) 35.0 - 45.0 mmHg   pO2, Arterial 57.0 (L) 80.0 - 100.0 mmHg   Bicarbonate 25.1 (H) 20.0 - 24.0 mEq/L   Acid-Base Excess 1.5 0.0 - 2.0 mmol/L   O2 Saturation 87.4 %   Patient temperature 37.0    Collection site RIGHT RADIAL    Drawn by COLLECTED BY RT    Sample type ARTERIAL    Allens test (pass/fail) PASS PASS  Glucose, capillary   Collection Time: 02/23/16 11:52 AM  Result Value Ref Range   Glucose-Capillary 104 (H) 65 - 99 mg/dL  HIV antibody   Collection Time: 02/23/16 11:53 AM  Result Value Ref Range   HIV Screen 4th Generation wRfx Non Reactive Non Reactive  ANA, IFA (with reflex)   Collection Time: 02/23/16 11:53 AM  Result Value Ref Range   ANA Ab, IFA Negative   Rheumatoid factor   Collection Time: 02/23/16 11:53 AM  Result  Value Ref Range   Rheumatoid fact SerPl-aCnc 15.5 (H) 0.0 - 13.9 IU/mL  CYCLIC CITRUL PEPTIDE ANTIBODY, IGG/IGA   Collection Time: 02/23/16 11:53 AM  Result Value Ref Range   CCP Antibodies IgG/IgA  4 0 - 19 units  Anti-DNA antibody, double-stranded   Collection Time: 02/23/16 11:53 AM  Result Value Ref Range   ds DNA Ab <1 0 - 9 IU/mL  Mpo/pr-3 (anca) antibodies   Collection Time: 02/23/16 11:53 AM  Result Value Ref Range   Myeloperoxidase Abs <9.0 0.0 - 9.0 U/mL   ANCA Proteinase 3 <3.5 0.0 - 3.5 U/mL  Procalcitonin - Baseline   Collection Time: 02/23/16 11:53 AM  Result Value Ref Range   Procalcitonin 0.54 ng/mL  Legionella Pneumophila Serogp 1 Ur Ag   Collection Time: 02/23/16  1:36 PM  Result Value Ref Range   L. pneumophila Serogp 1 Ur Ag Negative Negative   Source of Sample URINE, CLEAN CATCH   Strep pneumoniae urinary antigen   Collection Time: 02/23/16  1:36 PM  Result Value Ref Range   Strep Pneumo Urinary Antigen NEGATIVE NEGATIVE  Urinalysis, Routine w reflex microscopic (not at [facility])   Collection Time: 02/23/16  1:36 PM  Result Value Ref Range   Color, Urine AMBER (A) YELLOW   APPearance CLEAR CLEAR   Specific Gravity, Urine 1.022 1.005 - 1.030   pH 7.0 5.0 - 8.0   Glucose, UA NEGATIVE NEGATIVE mg/dL   Hgb urine dipstick NEGATIVE NEGATIVE   Bilirubin Urine NEGATIVE NEGATIVE   Ketones, ur 40 (A) NEGATIVE mg/dL   Protein, ur 30 (A) NEGATIVE mg/dL   Nitrite NEGATIVE NEGATIVE   Leukocytes, UA NEGATIVE NEGATIVE  Urine microscopic-add on   Collection Time: 02/23/16  1:36 PM  Result Value Ref Range   Squamous Epithelial / HPF 0-5 (A) NONE SEEN   WBC, UA 0-5 0 - 5 WBC/hpf   RBC / HPF 0-5 0 - 5 RBC/hpf   Bacteria, UA RARE (A) NONE SEEN  Glucose, capillary   Collection Time: 02/23/16  3:41 PM  Result Value Ref Range   Glucose-Capillary 98 65 - 99 mg/dL  I-STAT 3, arterial blood gas (G3+)   Collection Time: 02/23/16  6:17 PM  Result Value Ref Range   pH, Arterial 7.385 7.350 - 7.450   pCO2 arterial 46.5 (H) 35.0 - 45.0 mmHg   pO2, Arterial 105.0 (H) 80.0 - 100.0 mmHg   Bicarbonate 27.8 (H) 20.0 - 24.0 mEq/L   TCO2 29 0 - 100 mmol/L   O2 Saturation 98.0 %    Acid-Base Excess 2.0 0.0 - 2.0 mmol/L   Patient temperature HIDE    Collection site RADIAL, ALLEN'S TEST ACCEPTABLE    Drawn by Operator    Sample type ARTERIAL   Triglycerides   Collection Time: 02/23/16  7:37 PM  Result Value Ref Range   Triglycerides 150 (H) <150 mg/dL  Culture, respiratory (NON-Expectorated)   Collection Time: 02/23/16  8:22 PM   Specimen: Tracheal Aspirate; Respiratory  Result Value Ref Range   Specimen Description TRACHEAL ASPIRATE    Special Requests Normal    Gram Stain      MODERATE WBC PRESENT,BOTH PMN AND MONONUCLEAR NO SQUAMOUS EPITHELIAL CELLS SEEN RARE GRAM POSITIVE COCCI IN PAIRS Performed at [facility]    Culture      NORMAL OROPHARYNGEAL FLORA Performed at [facility]    Report Status 02/28/2016 FINAL   I-STAT 3, arterial blood gas (G3+)   Collection Time: 02/23/16  8:23 PM  Result Value Ref Range   pH, Arterial 7.258 (L) 7.350 - 7.450   pCO2 arterial 64.1 (HH) 35.0 - 45.0 mmHg   pO2, Arterial 189.0 (H) 80.0 - 100.0 mmHg   Bicarbonate 28.0 (H) 20.0 - 24.0 mEq/L   TCO2 30 0 - 100 mmol/L   O2 Saturation 99.0 %   Patient temperature 102.3 F    Collection site RADIAL, ALLEN'S TEST ACCEPTABLE    Drawn by RT    Sample type ARTERIAL    Comment NOTIFIED PHYSICIAN   I-STAT 3, arterial blood gas (G3+)   Collection Time: 02/23/16  9:38 PM  Result Value Ref Range   pH, Arterial 7.384 7.350 - 7.450   pCO2 arterial 46.6 (H) 35.0 - 45.0 mmHg   pO2, Arterial 128.0 (H) 80.0 - 100.0 mmHg   Bicarbonate 27.3 (H) 20.0 - 24.0 mEq/L   TCO2 29 0 - 100 mmol/L   O2 Saturation 99.0 %   Acid-Base Excess 2.0 0.0 - 2.0 mmol/L   Patient temperature 102.0 F    Collection site RADIAL, ALLEN'S TEST ACCEPTABLE    Drawn by RT    Sample type ARTERIAL   Glucose, capillary   Collection Time: 02/24/16 12:06 AM  Result Value Ref Range   Glucose-Capillary 109 (H) 65 - 99 mg/dL  I-STAT 3, arterial blood gas (G3+)   Collection Time: 02/24/16  12:53 AM  Result Value Ref Range   pH, Arterial 7.435 7.350 - 7.450   pCO2 arterial 41.8 35.0 - 45.0 mmHg   pO2, Arterial 145.0 (H) 80.0 - 100.0 mmHg   Bicarbonate 27.8 (H) 20.0 - 24.0 mEq/L   TCO2 29 0 - 100 mmol/L   O2 Saturation 99.0 %   Acid-Base Excess 4.0 (H) 0.0 - 2.0 mmol/L   Patient temperature 100.7 F    Collection site RADIAL, ALLEN'S TEST ACCEPTABLE    Drawn by RT    Sample type ARTERIAL   Type and screen [facility]   Collection Time: 02/24/16  2:27 AM  Result Value Ref Range   ABO/RH(D) A NEG    Antibody Screen NEG    Sample Expiration 02/27/2016    Unit Number [id number]    Blood Component Type RED CELLS,LR    Unit division 00    Status of Unit REL FROM [facility]    Transfusion Status OK TO TRANSFUSE    Crossmatch Result Compatible    Unit Number [id number]    Blood Component Type RED CELLS,LR    Unit division 00    Status of Unit REL FROM [facility]    Transfusion Status OK TO TRANSFUSE    Crossmatch Result Compatible   ABO/Rh   Collection Time: 02/24/16  2:27 AM  Result Value Ref Range   ABO/RH(D) A NEG   Procalcitonin   Collection Time: 02/24/16  2:49 AM  Result Value Ref Range   Procalcitonin 1.13 ng/mL  CBC with Differential/Platelet   Collection Time: 02/24/16  2:49 AM  Result Value Ref Range   WBC 8.6 4.0 - 10.5 K/uL   RBC 3.70 (L) 4.22 - 5.81 MIL/uL   Hemoglobin 10.9 (L) 13.0 - 17.0 g/dL   HCT 67.2 (L) 60.9 - 47.9 %   MCV 88.4 78.0 - 100.0 fL   MCH 29.5 26.0 - 34.0 pg   MCHC 33.3 30.0 - 36.0 g/dL   RDW 85.4 88.4 - 84.4 %   Platelets 159 150 - 400 K/uL   Neutrophils Relative % 79 %  Neutro Abs 6.8 1.7 - 7.7 K/uL   Lymphocytes Relative 13 %   Lymphs Abs 1.1 0.7 - 4.0 K/uL   Monocytes Relative 7 %   Monocytes Absolute 0.6 0.1 - 1.0 K/uL   Eosinophils Relative 1 %   Eosinophils Absolute 0.1 0.0 - 0.7 K/uL   Basophils Relative 0 %   Basophils Absolute 0.0 0.0 - 0.1 K/uL  Basic metabolic panel   Collection Time: 02/24/16   2:49 AM  Result Value Ref Range   Sodium 136 135 - 145 mmol/L   Potassium 3.9 3.5 - 5.1 mmol/L   Chloride 101 101 - 111 mmol/L   CO2 22 22 - 32 mmol/L   Glucose, Bld 137 (H) 65 - 99 mg/dL   BUN 19 6 - 20 mg/dL   Creatinine, Ser 8.79 0.61 - 1.24 mg/dL   Calcium 8.3 (L) 8.9 - 10.3 mg/dL   GFR calc non Af Amer >60 >60 mL/min   GFR calc Af Amer >60 >60 mL/min   Anion gap 13 5 - 15  Magnesium   Collection Time: 02/24/16  2:49 AM  Result Value Ref Range   Magnesium 2.1 1.7 - 2.4 mg/dL  Phosphorus   Collection Time: 02/24/16  2:49 AM  Result Value Ref Range   Phosphorus 3.2 2.5 - 4.6 mg/dL  Protime-INR   Collection Time: 02/24/16  2:49 AM  Result Value Ref Range   Prothrombin Time 16.6 (H) 11.6 - 15.2 seconds   INR 1.33 0.00 - 1.49  APTT   Collection Time: 02/24/16  2:49 AM  Result Value Ref Range   aPTT 32 24 - 37 seconds  Surgical pcr screen   Collection Time: 02/24/16  3:26 AM   Specimen: Nasal Mucosa; Nasal Swab  Result Value Ref Range   MRSA, PCR NEGATIVE NEGATIVE   Staphylococcus aureus NEGATIVE NEGATIVE  Glucose, capillary   Collection Time: 02/24/16  3:28 AM  Result Value Ref Range   Glucose-Capillary 114 (H) 65 - 99 mg/dL  I-STAT 3, arterial blood gas (G3+)   Collection Time: 02/24/16  3:47 AM  Result Value Ref Range   pH, Arterial 7.325 (L) 7.350 - 7.450   pCO2 arterial 53.4 (H) 35.0 - 45.0 mmHg   pO2, Arterial 191.0 (H) 80.0 - 100.0 mmHg   Bicarbonate 27.8 (H) 20.0 - 24.0 mEq/L   TCO2 29 0 - 100 mmol/L   O2 Saturation 100.0 %   Acid-Base Excess 1.0 0.0 - 2.0 mmol/L   Patient temperature 99.0 F    Collection site RADIAL, ALLEN'S TEST ACCEPTABLE    Drawn by RT    Sample type ARTERIAL   Prepare RBC   Collection Time: 02/24/16  7:38 AM  Result Value Ref Range   Order Confirmation ORDER PROCESSED BY BLOOD BANK   Fungus Culture With Stain (Not @ [facility])   Collection Time: 02/24/16  8:55 AM   Specimen: Pleural, Right; Body Fluid  Result Value Ref Range    Fungus Stain Final report    Fungus (Mycology) Culture Final report    Fungal Source FLUID   Acid Fast Smear (AFB)   Collection Time: 02/24/16  8:55 AM   Specimen: Pleural, Right; Body Fluid  Result Value Ref Range   AFB Specimen Processing Concentration    Acid Fast Smear Negative    Source (AFB) FLUID   Culture, body fluid-bottle   Collection Time: 02/24/16  8:55 AM   Specimen: Fluid  Result Value Ref Range   Specimen Description FLUID RIGHT LUNG  Special Requests POF ZOSYN  AND VANCOMYCIN     Culture NO GROWTH 5 DAYS    Report Status 02/29/2016 FINAL   Gram stain   Collection Time: 02/24/16  8:55 AM   Specimen: Fluid  Result Value Ref Range   Specimen Description FLUID RIGHT LUNG    Special Requests POF ZOSYN  AND VANCOMYCIN     Gram Stain      MODERATE WBC PRESENT,BOTH PMN AND MONONUCLEAR NO ORGANISMS SEEN    Report Status 02/24/2016 FINAL   Fungus Culture Result   Collection Time: 02/24/16  8:55 AM  Result Value Ref Range   Result 1 Comment   Result   Collection Time: 02/24/16  8:55 AM  Result Value Ref Range   Fungal result 1 Comment   Tissue culture   Collection Time: 02/24/16  9:03 AM   Specimen: Pleural, Right; Lung  Result Value Ref Range   Specimen Description TISSUE RIGHT LUNG    Special Requests PLEURAL PEEL POF ZOSYN  AND VANCOMYCIN     Gram Stain      ABUNDANT WBC PRESENT, PREDOMINANTLY PMN NO ORGANISMS SEEN Performed at [facility]    Culture      NO GROWTH 3 DAYS Performed at [facility]    Report Status 02/28/2016 FINAL   Fungus Culture With Stain (Not @ [facility])   Collection Time: 02/24/16  9:03 AM   Specimen: Pleural, Right; Lung  Result Value Ref Range   Fungus Stain Final report    Fungus (Mycology) Culture Final report    Fungal Source TISSUE   Acid Fast Smear (AFB)   Collection Time: 02/24/16  9:03 AM   Specimen: Pleural, Right; Lung  Result Value Ref Range   AFB Specimen Processing Comment    Acid Fast Smear Negative     Source (AFB) TISSUE   Fungus Culture Result   Collection Time: 02/24/16  9:03 AM  Result Value Ref Range   Result 1 Comment   Result   Collection Time: 02/24/16  9:03 AM  Result Value Ref Range   Fungal result 1 Comment   Glucose, capillary   Collection Time: 02/24/16 11:37 AM  Result Value Ref Range   Glucose-Capillary 126 (H) 65 - 99 mg/dL  I-STAT 3, arterial blood gas (G3+)   Collection Time: 02/24/16 12:04 PM  Result Value Ref Range   pH, Arterial 7.398 7.350 - 7.450   pCO2 arterial 44.9 35.0 - 45.0 mmHg   pO2, Arterial 94.0 80.0 - 100.0 mmHg   Bicarbonate 27.7 (H) 20.0 - 24.0 mEq/L   TCO2 29 0 - 100 mmol/L   O2 Saturation 97.0 %   Acid-Base Excess 2.0 0.0 - 2.0 mmol/L   Patient temperature 98.3 F    Collection site ARTERIAL LINE    Drawn by Operator    Sample type ARTERIAL   Glucose, capillary   Collection Time: 02/24/16  3:23 PM  Result Value Ref Range   Glucose-Capillary 153 (H) 65 - 99 mg/dL  Glucose, capillary   Collection Time: 02/24/16  7:45 PM  Result Value Ref Range   Glucose-Capillary 140 (H) 65 - 99 mg/dL  Glucose, capillary   Collection Time: 02/24/16 11:32 PM  Result Value Ref Range   Glucose-Capillary 132 (H) 65 - 99 mg/dL  Magnesium   Collection Time: 02/25/16 12:30 AM  Result Value Ref Range   Magnesium 2.2 1.7 - 2.4 mg/dL  Phosphorus   Collection Time: 02/25/16 12:30 AM  Result Value Ref Range   Phosphorus 3.6 2.5 -  4.6 mg/dL  Blood gas, arterial   Collection Time: 02/25/16  3:30 AM  Result Value Ref Range   FIO2 0.40    Delivery systems VENTILATOR    Mode PRESSURE REGULATED VOLUME CONTROL    MECHVT 510 mL   RATE 22 resp/min   PEEP 8.0 cm H20   pH, Arterial 7.431 7.350 - 7.450   pCO2 arterial 42.4 35.0 - 45.0 mmHg   pO2, Arterial 122 (H) 80.0 - 100.0 mmHg   Bicarbonate 27.5 (H) 20.0 - 24.0 mEq/L   TCO2 28.8 0 - 100 mmol/L   Acid-Base Excess 3.5 (H) 0.0 - 2.0 mmol/L   O2 Saturation 98.3 %   Patient temperature 99.3    Collection  site A-LINE    Drawn by [id number]    Sample type ARTERIAL DRAW   Glucose, capillary   Collection Time: 02/25/16  3:47 AM  Result Value Ref Range   Glucose-Capillary 149 (H) 65 - 99 mg/dL  CBC with Differential/Platelet   Collection Time: 02/25/16  4:53 AM  Result Value Ref Range   WBC 7.3 4.0 - 10.5 K/uL   RBC 4.01 (L) 4.22 - 5.81 MIL/uL   Hemoglobin 11.6 (L) 13.0 - 17.0 g/dL   HCT 64.2 (L) 60.9 - 47.9 %   MCV 89.0 78.0 - 100.0 fL   MCH 28.9 26.0 - 34.0 pg   MCHC 32.5 30.0 - 36.0 g/dL   RDW 85.2 88.4 - 84.4 %   Platelets 195 150 - 400 K/uL   Neutrophils Relative % 77 %   Neutro Abs 5.6 1.7 - 7.7 K/uL   Lymphocytes Relative 11 %   Lymphs Abs 0.8 0.7 - 4.0 K/uL   Monocytes Relative 10 %   Monocytes Absolute 0.7 0.1 - 1.0 K/uL   Eosinophils Relative 2 %   Eosinophils Absolute 0.1 0.0 - 0.7 K/uL   Basophils Relative 0 %   Basophils Absolute 0.0 0.0 - 0.1 K/uL  Basic metabolic panel   Collection Time: 02/25/16  4:53 AM  Result Value Ref Range   Sodium 138 135 - 145 mmol/L   Potassium 3.9 3.5 - 5.1 mmol/L   Chloride 101 101 - 111 mmol/L   CO2 25 22 - 32 mmol/L   Glucose, Bld 137 (H) 65 - 99 mg/dL   BUN 17 6 - 20 mg/dL   Creatinine, Ser 8.96 0.61 - 1.24 mg/dL   Calcium 7.7 (L) 8.9 - 10.3 mg/dL   GFR calc non Af Amer >60 >60 mL/min   GFR calc Af Amer >60 >60 mL/min   Anion gap 12 5 - 15  Magnesium   Collection Time: 02/25/16  4:53 AM  Result Value Ref Range   Magnesium 2.1 1.7 - 2.4 mg/dL  Phosphorus   Collection Time: 02/25/16  4:53 AM  Result Value Ref Range   Phosphorus 3.8 2.5 - 4.6 mg/dL  Vancomycin , trough   Collection Time: 02/25/16  4:53 AM  Result Value Ref Range   Vancomycin  Tr 7 (L) 10.0 - 20.0 ug/mL  Glucose, capillary   Collection Time: 02/25/16  7:51 AM  Result Value Ref Range   Glucose-Capillary 136 (H) 65 - 99 mg/dL  Glucose, capillary   Collection Time: 02/25/16 11:10 AM  Result Value Ref Range   Glucose-Capillary 124 (H) 65 - 99 mg/dL  Magnesium    Collection Time: 02/25/16 12:17 PM  Result Value Ref Range   Magnesium 2.2 1.7 - 2.4 mg/dL  Phosphorus   Collection Time: 02/25/16 12:17 PM  Result  Value Ref Range   Phosphorus 4.2 2.5 - 4.6 mg/dL  Glucose, capillary   Collection Time: 02/25/16  4:17 PM  Result Value Ref Range   Glucose-Capillary 117 (H) 65 - 99 mg/dL  Glucose, capillary   Collection Time: 02/25/16  7:52 PM  Result Value Ref Range   Glucose-Capillary 130 (H) 65 - 99 mg/dL  Magnesium   Collection Time: 02/25/16 11:23 PM  Result Value Ref Range   Magnesium 2.2 1.7 - 2.4 mg/dL  Phosphorus   Collection Time: 02/25/16 11:23 PM  Result Value Ref Range   Phosphorus 3.5 2.5 - 4.6 mg/dL  Glucose, capillary   Collection Time: 02/25/16 11:49 PM  Result Value Ref Range   Glucose-Capillary 151 (H) 65 - 99 mg/dL  CBC with Differential/Platelet   Collection Time: 02/26/16  2:13 AM  Result Value Ref Range   WBC 5.8 4.0 - 10.5 K/uL   RBC 3.57 (L) 4.22 - 5.81 MIL/uL   Hemoglobin 10.2 (L) 13.0 - 17.0 g/dL   HCT 67.2 (L) 60.9 - 47.9 %   MCV 91.6 78.0 - 100.0 fL   MCH 28.6 26.0 - 34.0 pg   MCHC 31.2 30.0 - 36.0 g/dL   RDW 84.9 88.4 - 84.4 %   Platelets 182 150 - 400 K/uL   Neutrophils Relative % 73 %   Neutro Abs 4.2 1.7 - 7.7 K/uL   Lymphocytes Relative 17 %   Lymphs Abs 1.0 0.7 - 4.0 K/uL   Monocytes Relative 8 %   Monocytes Absolute 0.5 0.1 - 1.0 K/uL   Eosinophils Relative 2 %   Eosinophils Absolute 0.1 0.0 - 0.7 K/uL   Basophils Relative 0 %   Basophils Absolute 0.0 0.0 - 0.1 K/uL  Magnesium   Collection Time: 02/26/16  2:13 AM  Result Value Ref Range   Magnesium 2.0 1.7 - 2.4 mg/dL  Phosphorus   Collection Time: 02/26/16  2:13 AM  Result Value Ref Range   Phosphorus 3.6 2.5 - 4.6 mg/dL  Comprehensive metabolic panel   Collection Time: 02/26/16  2:13 AM  Result Value Ref Range   Sodium 140 135 - 145 mmol/L   Potassium 4.0 3.5 - 5.1 mmol/L   Chloride 102 101 - 111 mmol/L   CO2 29 22 - 32 mmol/L    Glucose, Bld 112 (H) 65 - 99 mg/dL   BUN 17 6 - 20 mg/dL   Creatinine, Ser 9.09 0.61 - 1.24 mg/dL   Calcium 8.0 (L) 8.9 - 10.3 mg/dL   Total Protein 4.7 (L) 6.5 - 8.1 g/dL   Albumin 1.8 (L) 3.5 - 5.0 g/dL   AST 54 (H) 15 - 41 U/L   ALT 42 17 - 63 U/L   Alkaline Phosphatase 44 38 - 126 U/L   Total Bilirubin 1.8 (H) 0.3 - 1.2 mg/dL   GFR calc non Af Amer >60 >60 mL/min   GFR calc Af Amer >60 >60 mL/min   Anion gap 9 5 - 15  Glucose, capillary   Collection Time: 02/26/16  3:48 AM  Result Value Ref Range   Glucose-Capillary 99 65 - 99 mg/dL  Glucose, capillary   Collection Time: 02/26/16  8:31 AM  Result Value Ref Range   Glucose-Capillary 136 (H) 65 - 99 mg/dL  Glucose, capillary   Collection Time: 02/26/16 12:37 PM  Result Value Ref Range   Glucose-Capillary 105 (H) 65 - 99 mg/dL  Glucose, capillary   Collection Time: 02/26/16  4:17 PM  Result Value Ref Range  Glucose-Capillary 108 (H) 65 - 99 mg/dL  Glucose, capillary   Collection Time: 02/26/16  7:58 PM  Result Value Ref Range   Glucose-Capillary 112 (H) 65 - 99 mg/dL   Comment 1 Notify RN   Glucose, capillary   Collection Time: 02/26/16 11:48 PM  Result Value Ref Range   Glucose-Capillary 110 (H) 65 - 99 mg/dL   Comment 1 Notify RN   Glucose, capillary   Collection Time: 02/27/16  3:56 AM  Result Value Ref Range   Glucose-Capillary 112 (H) 65 - 99 mg/dL   Comment 1 Notify RN   CBC with Differential/Platelet   Collection Time: 02/27/16  4:15 AM  Result Value Ref Range   WBC 5.8 4.0 - 10.5 K/uL   RBC 3.62 (L) 4.22 - 5.81 MIL/uL   Hemoglobin 10.3 (L) 13.0 - 17.0 g/dL   HCT 66.9 (L) 60.9 - 47.9 %   MCV 91.2 78.0 - 100.0 fL   MCH 28.5 26.0 - 34.0 pg   MCHC 31.2 30.0 - 36.0 g/dL   RDW 84.7 88.4 - 84.4 %   Platelets 231 150 - 400 K/uL   Neutrophils Relative % 65 %   Neutro Abs 3.8 1.7 - 7.7 K/uL   Lymphocytes Relative 22 %   Lymphs Abs 1.3 0.7 - 4.0 K/uL   Monocytes Relative 10 %   Monocytes Absolute 0.6 0.1 -  1.0 K/uL   Eosinophils Relative 3 %   Eosinophils Absolute 0.2 0.0 - 0.7 K/uL   Basophils Relative 0 %   Basophils Absolute 0.0 0.0 - 0.1 K/uL  Basic metabolic panel   Collection Time: 02/27/16  4:15 AM  Result Value Ref Range   Sodium 141 135 - 145 mmol/L   Potassium 3.4 (L) 3.5 - 5.1 mmol/L   Chloride 99 (L) 101 - 111 mmol/L   CO2 32 22 - 32 mmol/L   Glucose, Bld 124 (H) 65 - 99 mg/dL   BUN 24 (H) 6 - 20 mg/dL   Creatinine, Ser 8.98 0.61 - 1.24 mg/dL   Calcium 8.2 (L) 8.9 - 10.3 mg/dL   GFR calc non Af Amer >60 >60 mL/min   GFR calc Af Amer >60 >60 mL/min   Anion gap 10 5 - 15  Magnesium   Collection Time: 02/27/16  4:15 AM  Result Value Ref Range   Magnesium 2.1 1.7 - 2.4 mg/dL  Phosphorus   Collection Time: 02/27/16  4:15 AM  Result Value Ref Range   Phosphorus 3.8 2.5 - 4.6 mg/dL  Glucose, capillary   Collection Time: 02/27/16  8:19 AM  Result Value Ref Range   Glucose-Capillary 138 (H) 65 - 99 mg/dL  Vancomycin , trough   Collection Time: 02/27/16 11:26 AM  Result Value Ref Range   Vancomycin  Tr 21 (H) 10.0 - 20.0 ug/mL  Glucose, capillary   Collection Time: 02/27/16 12:24 PM  Result Value Ref Range   Glucose-Capillary 117 (H) 65 - 99 mg/dL  Glucose, capillary   Collection Time: 02/27/16  4:12 PM  Result Value Ref Range   Glucose-Capillary 136 (H) 65 - 99 mg/dL  Comprehensive metabolic panel   Collection Time: 02/28/16  3:44 AM  Result Value Ref Range   Sodium 141 135 - 145 mmol/L   Potassium 5.0 3.5 - 5.1 mmol/L   Chloride 108 101 - 111 mmol/L   CO2 24 22 - 32 mmol/L   Glucose, Bld 124 (H) 65 - 99 mg/dL   BUN 17 6 - 20 mg/dL  Creatinine, Ser 0.92 0.61 - 1.24 mg/dL   Calcium 7.9 (L) 8.9 - 10.3 mg/dL   Total Protein 4.9 (L) 6.5 - 8.1 g/dL   Albumin 2.1 (L) 3.5 - 5.0 g/dL   AST 74 (H) 15 - 41 U/L   ALT 45 17 - 63 U/L   Alkaline Phosphatase 45 38 - 126 U/L   Total Bilirubin 1.6 (H) 0.3 - 1.2 mg/dL   GFR calc non Af Amer >60 >60 mL/min   GFR calc Af Amer  >60 >60 mL/min   Anion gap 9 5 - 15  Comprehensive metabolic panel   Collection Time: 03/01/16  5:33 AM  Result Value Ref Range   Sodium 135 135 - 145 mmol/L   Potassium 4.2 3.5 - 5.1 mmol/L   Chloride 103 101 - 111 mmol/L   CO2 22 22 - 32 mmol/L   Glucose, Bld 110 (H) 65 - 99 mg/dL   BUN 17 6 - 20 mg/dL   Creatinine, Ser 9.11 0.61 - 1.24 mg/dL   Calcium 8.1 (L) 8.9 - 10.3 mg/dL   Total Protein 5.5 (L) 6.5 - 8.1 g/dL   Albumin 2.3 (L) 3.5 - 5.0 g/dL   AST 31 15 - 41 U/L   ALT 46 17 - 63 U/L   Alkaline Phosphatase 51 38 - 126 U/L   Total Bilirubin 0.9 0.3 - 1.2 mg/dL   GFR calc non Af Amer >60 >60 mL/min   GFR calc Af Amer >60 >60 mL/min   Anion gap 10 5 - 15  CBC with Differential/Platelet   Collection Time: 03/01/16  5:33 AM  Result Value Ref Range   WBC 8.4 4.0 - 10.5 K/uL   RBC 3.82 (L) 4.22 - 5.81 MIL/uL   Hemoglobin 11.2 (L) 13.0 - 17.0 g/dL   HCT 66.3 (L) 60.9 - 47.9 %   MCV 88.0 78.0 - 100.0 fL   MCH 29.3 26.0 - 34.0 pg   MCHC 33.3 30.0 - 36.0 g/dL   RDW 85.0 88.4 - 84.4 %   Platelets 288 150 - 400 K/uL   Neutrophils Relative % 68 %   Neutro Abs 5.7 1.7 - 7.7 K/uL   Lymphocytes Relative 22 %   Lymphs Abs 1.9 0.7 - 4.0 K/uL   Monocytes Relative 7 %   Monocytes Absolute 0.6 0.1 - 1.0 K/uL   Eosinophils Relative 3 %   Eosinophils Absolute 0.2 0.0 - 0.7 K/uL   Basophils Relative 0 %   Basophils Absolute 0.0 0.0 - 0.1 K/uL  Magnesium   Collection Time: 03/01/16  5:33 AM  Result Value Ref Range   Magnesium 2.0 1.7 - 2.4 mg/dL  Comprehensive metabolic panel   Collection Time: 03/02/16  3:32 AM  Result Value Ref Range   Sodium 139 135 - 145 mmol/L   Potassium 4.2 3.5 - 5.1 mmol/L   Chloride 104 101 - 111 mmol/L   CO2 27 22 - 32 mmol/L   Glucose, Bld 115 (H) 65 - 99 mg/dL   BUN 15 6 - 20 mg/dL   Creatinine, Ser 9.04 0.61 - 1.24 mg/dL   Calcium 8.2 (L) 8.9 - 10.3 mg/dL   Total Protein 5.7 (L) 6.5 - 8.1 g/dL   Albumin 2.3 (L) 3.5 - 5.0 g/dL   AST 25 15 - 41  U/L   ALT 42 17 - 63 U/L   Alkaline Phosphatase 55 38 - 126 U/L   Total Bilirubin 0.7 0.3 - 1.2 mg/dL   GFR calc non Af  Amer >60 >60 mL/min   GFR calc Af Amer >60 >60 mL/min   Anion gap 8 5 - 15  CBC with Differential/Platelet   Collection Time: 03/02/16  3:32 AM  Result Value Ref Range   WBC 7.3 4.0 - 10.5 K/uL   RBC 3.70 (L) 4.22 - 5.81 MIL/uL   Hemoglobin 10.4 (L) 13.0 - 17.0 g/dL   HCT 67.3 (L) 60.9 - 47.9 %   MCV 88.1 78.0 - 100.0 fL   MCH 28.1 26.0 - 34.0 pg   MCHC 31.9 30.0 - 36.0 g/dL   RDW 85.0 88.4 - 84.4 %   Platelets 269 150 - 400 K/uL   Neutrophils Relative % 71 %   Neutro Abs 5.2 1.7 - 7.7 K/uL   Lymphocytes Relative 18 %   Lymphs Abs 1.3 0.7 - 4.0 K/uL   Monocytes Relative 7 %   Monocytes Absolute 0.5 0.1 - 1.0 K/uL   Eosinophils Relative 3 %   Eosinophils Absolute 0.3 0.0 - 0.7 K/uL   Basophils Relative 0 %   Basophils Absolute 0.0 0.0 - 0.1 K/uL  Magnesium   Collection Time: 03/02/16  3:32 AM  Result Value Ref Range   Magnesium 2.1 1.7 - 2.4 mg/dL       Pertinent labs & imaging results that were available during my care of the patient were reviewed by me and considered in my medical decision making.  Assessment & Plan:  Pope was seen today for establish care.  Diagnoses and all orders for this visit:  Prediabetes -     HgB A1c -     metFORMIN (GLUCOPHAGE) 1000 MG tablet; Take 1 tablet (1,000 mg total) by mouth 2 (two) times daily with a meal. -     Blood Glucose Monitoring Suppl DEVI; 1 each by Does not apply route as directed. Dispense based on patient and insurance preference. Use up to four times daily as directed. (FOR ICD-10 E10.9, E11.9). -     Lancet Device MISC; 1 each by Does not apply route as directed. Use once daily as directed.  Polydipsia -     HgB A1c  Allergic rhinitis due to pollen, unspecified seasonality -     cetirizine (ZYRTEC ALLERGY) 10 MG tablet; Take 1 tablet (10 mg total) by mouth daily.  Essential hypertension -      Hepatitis C antibody -     lisinopril (ZESTRIL) 10 MG tablet; Take 1 tablet (10 mg total) by mouth daily.  Nocturia -     PSA, total and free  Cigarette nicotine  dependence without complication  Encounter for smoking cessation counseling  History of hyperglycemia -     HgB A1c -     Glucose Hemocue Waived -     Lancet Device MISC; 1 each by Does not apply route as directed. Use once daily as directed.  OSA on CPAP -     Home sleep test -     Ambulatory referral to Sleep Studies -     metFORMIN (GLUCOPHAGE) 1000 MG tablet; Take 1 tablet (1,000 mg total) by mouth 2 (two) times daily with a meal.  Class 1 obesity without serious comorbidity with body mass index (BMI) of 32.0 to 32.9 in adult, unspecified obesity type -     metFORMIN (GLUCOPHAGE) 1000 MG tablet; Take 1 tablet (1,000 mg total) by mouth 2 (two) times daily with a meal.  Screening for colon cancer -     Cologuard  Encounter for general adult medical examination  with abnormal findings -     Lipid panel -     PSA, total and free -     Thyroid Panel With TSH -     Hepatitis C antibody -     CMP14+EGFR -     CBC with Differential/Platelet -     HgB A1c -     Glucose Hemocue Waived -     Home sleep test -     Ambulatory referral to Sleep Studies -     Cologuard -     lisinopril (ZESTRIL) 10 MG tablet; Take 1 tablet (10 mg total) by mouth daily. -     metFORMIN (GLUCOPHAGE) 1000 MG tablet; Take 1 tablet (1,000 mg total) by mouth 2 (two) times daily with a meal. -     Blood Glucose Monitoring Suppl DEVI; 1 each by Does not apply route as directed. Dispense based on patient and insurance preference. Use up to four times daily as directed. (FOR ICD-10 E10.9, E11.9). -     Lancet Device MISC; 1 each by Does not apply route as directed. Use once daily as directed. -     cetirizine (ZYRTEC ALLERGY) 10 MG tablet; Take 1 tablet (10 mg total) by mouth daily.     Assessment and Plan [name] Snow Caucasian male seen today  to establish care, no acute distress Assessment & Plan Adult Wellness Visit Routine wellness visit with focus on lifestyle modifications and preventive care. - Ordered Cologuard for colon cancer screening. - Scheduled follow-up in 3 months.  Type 2 diabetes mellitus, newly diagnosed Newly diagnosed with fasting blood glucose of 356 mg/dL and J8r of 3.6%. Discussed dietary modifications and potential DKA due to family history. Initiated metformin with side effects explained. - Started metformin 1000 mg twice daily. - Ordered A1c test. - Ordered blood glucose test. - Provided blood glucose meter. - Advised dietary modifications: limit carbohydrates, increase protein intake.  Essential hypertension Hypertension with BP 138/82 mmHg. No current antihypertensive medication. Discussed lifestyle modifications. - Started lisinopril 10 mg daily. - Advised dietary modifications: decrease fried food, no salt, use more seasoning.  Hyperlipidemia Recent initiation of cholesterol medication. Awaiting lab results for cholesterol levels. - Restarted cholesterol medication. - Ordered cholesterol lab test.  Obstructive sleep apnea, suspected Suspected OSA with symptoms of loud snoring, apneic episodes, and daytime fatigue. No prior sleep study due to lack of insurance. - Ordered home sleep study.  Nocturia Frequent nocturnal urination possibly related to hyperglycemia. Dark, strong-smelling urine noted. - Monitor blood glucose levels. - PSA ordered  Obesity -Metformin at 1000 mg twice daily lifestyle modification decrease carbs, fried food increase intake of fruits and vegetable; start walking 3-4 times weekly 15 to 30 minutes increase as tolerated - Metformin 1000 mg twice daily  Polydipsia Order A1c to rule out possible diabetes  Nicotine  dependence, cigarettes and smokeless tobacco Nicotine  dependence with daily cigarette and smokeless tobacco use. Discussed risks including increased cancer  risk. - Advised on risks of tobacco use. - Encouraged smoking cessation. Smoking Cessation Counseling 3-mins: -Discussed smoking history and assessed readiness to quit. -Briefly educated patient on health risks of continued smoking, including increased risk of cardiovascular disease, respiratory conditions, and cancer. -Provided brief counseling on behavioral strategies: -Avoid triggers alcohol, certain social settings. - Deep breathing and relaxation techniques during cravings. Offered support resources: 1-800-QUIT-NOW or smokefree.gov Mobile apps such as QuitGuide or QuitStart Discussed pharmacotherapy options: Patient expressed no interest in quitting at this time. Will reassess readiness and offer continued support  at future visits.   Allergic rhinitis Symptoms of ear drainage. Discussed potential causes and treatment options. - Started Zyrtec for allergy management.   Health maintenance: Cologuard ordered  Labs: CBC, CMP, lipid, TSH, PSA result pending  Continue all other maintenance medications.  Follow up plan: Return in about 3 months (around 12/16/2024) for Chronic Diseases Management.   Continue healthy lifestyle choices, including diet (rich in fruits, vegetables, and lean proteins, and low in salt and simple carbohydrates) and exercise (at least 30 minutes of moderate physical activity daily).  Educational handout given for     Clinical References  Smokeless Tobacco Information, Adult  Tobacco is a leafy plant that contains a chemical called nicotine . Tobacco use is harmful to your health. Nicotine  affects the chemicals in your brain, and this can cause you to crave nicotine . These cravings can lead to addiction. Smokeless tobacco is tobacco that you put in your mouth instead of smoking it. It may also be called chewing tobacco or snuff. Smokeless tobacco is made from the leaves of tobacco plants and comes in many forms, such as: Loose, dry leaves. Plugs or  twists. Moist pouches. Dissolving lozenges or strips. Chewing, sucking, or holding the tobacco in your mouth causes your mouth to make more saliva. The saliva mixes with the tobacco to make tobacco juice that is swallowed or spit out. How can smokeless tobacco use affect me? All forms of tobacco contain many chemicals that can harm every organ in the body. Using smokeless tobacco increases your risk for: Cancer. Tobacco use is one of the leading causes of cancer. Smokeless tobacco is linked to cancer of the mouth, esophagus, and pancreas. Other long-term health problems, including high blood pressure, heart disease, and stroke. Addiction. Pregnancy complications. Pregnant women who use smokeless tobacco are more likely to have a miscarriage or deliver a baby too early (premature delivery). Mouth and dental problems, such as: Bad breath. Teeth that look yellow or brown. Mouth sores. Cracking and bleeding lips. Gum recession, gum disease, or tooth decay. Lesions on the soft tissues of your mouth (leukoplakia). The benefits of not using smokeless tobacco include: A healthy mind and body. Saving money. You avoid the cost of buying tobacco and the cost of treating illnesses that are caused by tobacco. What actions can I take to quit using tobacco? Ask your health care provider for help to quit using smokeless tobacco. This may involve treatment. These tips may also help you quit: Pick a date to quit. Set a date within the next 2 weeks. This gives you time to prepare. Write down the reasons why you are quitting. Keep this list in places where you will see it often, such as on your bathroom mirror or in your car or wallet. Identify the people, places, things, and activities that make you want to use smokeless tobacco (triggers). Avoid them. Tell your family and friends that you are quitting. Look for a support group in your area. Having support can make quitting easier. Get rid of any tobacco  you have and remove any tobacco smells. To do this: Throw away all containers of tobacco at home, at work, and in your car. Throw away any other items that you use regularly when you chew tobacco. Clean your car and make sure to remove all tobacco-related items. Clean your home, including curtains and carpets. Keep track of how many days have passed since you quit. It may help to cross days off a calendar. Remembering how long and hard you  have worked to quit can help you avoid using smokeless tobacco again. Stay positive. Be prepared for cravings. It is common to slip up when you first quit, so take it one day at a time. Stay busy and take care of your body. Get plenty of exercise. Drink enough water to keep your urine pale yellow. Where to find support Ask your health care provider if there is a local support group for quitting smokeless tobacco and any available online resources, such as: Smoke Free: www.veterans.smokefree.gov Be Tobacco Free: eopquic.com Tobacco Quitline: 1-855-QUIT-VET 906-313-4465). Hotlines, such as 1-800-QUIT-NOW 331-853-7286). Where to find more information You can learn more about the risks of using smokeless tobacco and the benefits of quitting from these sources: American Cancer Society: www.cancer.org National Cancer Institute: www.cancer.gov Centers for Disease Control and Prevention: footballexhibition.com.br Food and Drug Administration: https://mcknight.com/ Contact a health care provider if you have: Trouble quitting smokeless tobacco use on your own. White or other discolored patches in your mouth. Difficulty swallowing. A change in your voice. Unexplained weight loss. Stomach pain, nausea, or vomiting. Summary Smokeless tobacco contains many different chemicals that are known to cause cancer. Nicotine  is an addictive chemical in smokeless tobacco that affects your brain. The benefits of not using smokeless tobacco include having a  healthy mind and body and saving money. Tell your family and friends that you are quitting. Having support can make quitting easier. Ask your health care provider for help quitting smokeless tobacco. This may involve treatment. This information is not intended to replace advice given to you by your health care provider. Make sure you discuss any questions you have with your health care provider. Document Revised: 07/20/2021 Document Reviewed: 07/20/2021 Elsevier Patient Education  2024 Elsevier Inc. Steps to Quit Smoking Smoking tobacco is the leading cause of preventable death. It can affect almost every organ in the body. Smoking puts you and people around you at risk for many serious, long-lasting (chronic) diseases. Quitting smoking can be hard, but it is one of the best things that you can do for your health. It is never too late to quit. Do not give up if you cannot quit the first time. Some people need to try many times to quit. Do your best to stick to your quit plan, and talk with your doctor if you have any questions or concerns. How do I get ready to quit? Pick a date to quit. Set a date within the next 2 weeks to give you time to prepare. Write down the reasons why you are quitting. Keep this list in places where you will see it often. Tell your family, friends, and co-workers that you are quitting. Their support is important. Talk with your doctor about the choices that may help you quit. Find out if your health insurance will pay for these treatments. Know the people, places, things, and activities that make you want to smoke (triggers). Avoid them. What first steps can I take to quit smoking? Throw away all cigarettes at home, at work, and in your car. Throw away the things that you use when you smoke, such as ashtrays and lighters. Clean your car. Empty the ashtray. Clean your home, including curtains and carpets. What can I do to help me quit smoking? Talk with your doctor  about taking medicines and seeing a counselor. You are more likely to succeed when you do both. If you are pregnant or breastfeeding: Talk with your doctor about counseling or other ways to quit smoking. Do not  take medicine to help you quit smoking unless your doctor tells you to. Quit right away Quit smoking completely, instead of slowly cutting back on how much you smoke over a period of time. Stopping smoking right away may be more successful than slowly quitting. Go to counseling. In-person is best if this is an option. You are more likely to quit if you go to counseling sessions regularly. Take medicine You may take medicines to help you quit. Some medicines need a prescription, and some you can buy over-the-counter. Some medicines may contain a drug called nicotine  to replace the nicotine  in cigarettes. Medicines may: Help you stop having the desire to smoke (cravings). Help to stop the problems that come when you stop smoking (withdrawal symptoms). Your doctor may ask you to use: Nicotine  patches, gum, or lozenges. Nicotine  inhalers or sprays. Non-nicotine  medicine that you take by mouth. Find resources Find resources and other ways to help you quit smoking and remain smoke-free after you quit. They include: Online chats with a veterinary surgeon. Phone quitlines. Printed materials engineer. Support groups or group counseling. Text messaging programs. Mobile phone apps. Use apps on your mobile phone or tablet that can help you stick to your quit plan. Examples of free services include Quit Guide from the CDC and smokefree.gov   What can I do to make it easier to quit?  Talk to your family and friends. Ask them to support and encourage you. Call a phone quitline, such as 1-800-QUIT-NOW, reach out to support groups, or work with a veterinary surgeon. Ask people who smoke to not smoke around you. Avoid places that make you want to smoke, such as: Bars. Parties. Smoke-break areas at work. Spend  time with people who do not smoke. Lower the stress in your life. Stress can make you want to smoke. Try these things to lower stress: Getting regular exercise. Doing deep-breathing exercises. Doing yoga. Meditating. What benefits will I see if I quit smoking? Over time, you may have: A better sense of smell and taste. Less coughing and sore throat. A slower heart rate. Lower blood pressure. Clearer skin. Better breathing. Fewer sick days. Summary Quitting smoking can be hard, but it is one of the best things that you can do for your health. Do not give up if you cannot quit the first time. Some people need to try many times to quit. When you decide to quit smoking, make a plan to help you succeed. Quit smoking right away, not slowly over a period of time. When you start quitting, get help and support to keep you smoke-free. This information is not intended to replace advice given to you by your health care provider. Make sure you discuss any questions you have with your health care provider. Document Revised: 10/14/2021 Document Reviewed: 10/14/2021 Elsevier Patient Education  2024 Elsevier Inc. Health Maintenance, Male Adopting a healthy lifestyle and getting preventive care are important in promoting health and wellness. Ask your health care provider about: The right schedule for you to have regular tests and exams. Things you can do on your own to prevent diseases and keep yourself healthy. What should I know about diet, weight, and exercise? Eat a healthy diet  Eat a diet that includes plenty of vegetables, fruits, low-fat dairy products, and lean protein. Do not eat a lot of foods that are high in solid fats, added sugars, or sodium. Maintain a healthy weight Body mass index (BMI) is a measurement that can be used to identify possible weight problems.  It estimates body fat based on height and weight. Your health care provider can help determine your BMI and help you achieve  or maintain a healthy weight. Get regular exercise Get regular exercise. This is one of the most important things you can do for your health. Most adults should: Exercise for at least 150 minutes each week. The exercise should increase your heart rate and make you sweat (moderate-intensity exercise). Do strengthening exercises at least twice a week. This is in addition to the moderate-intensity exercise. Spend less time sitting. Even light physical activity can be beneficial. Watch cholesterol and blood lipids Have your blood tested for lipids and cholesterol at 46 years of age, then have this test every 5 years. You may need to have your cholesterol levels checked more often if: Your lipid or cholesterol levels are high. You are older than 46 years of age. You are at high risk for heart disease. What should I know about cancer screening? Many types of cancers can be detected early and may often be prevented. Depending on your health history and family history, you may need to have cancer screening at various ages. This may include screening for: Colorectal cancer. Prostate cancer. Skin cancer. Lung cancer. What should I know about heart disease, diabetes, and high blood pressure? Blood pressure and heart disease High blood pressure causes heart disease and increases the risk of stroke. This is more likely to develop in people who have high blood pressure readings or are overweight. Talk with your health care provider about your target blood pressure readings. Have your blood pressure checked: Every 3-5 years if you are 28-62 years of age. Every year if you are 68 years old or older. If you are between the ages of 18 and 42 and are a current or former smoker, ask your health care provider if you should have a one-time screening for abdominal aortic aneurysm (AAA). Diabetes Have regular diabetes screenings. This checks your fasting blood sugar level. Have the screening done: Once every  three years after age 70 if you are at a normal weight and have a low risk for diabetes. More often and at a younger age if you are overweight or have a high risk for diabetes. What should I know about preventing infection? Hepatitis B If you have a higher risk for hepatitis B, you should be screened for this virus. Talk with your health care provider to find out if you are at risk for hepatitis B infection. Hepatitis C Blood testing is recommended for: Everyone born from [birth year] through 1963. Anyone with known risk factors for hepatitis C. Sexually transmitted infections (STIs) You should be screened each year for STIs, including gonorrhea and chlamydia, if: You are sexually active and are younger than 46 years of age. You are older than 46 years of age and your health care provider tells you that you are at risk for this type of infection. Your sexual activity has changed since you were last screened, and you are at increased risk for chlamydia or gonorrhea. Ask your health care provider if you are at risk. Ask your health care provider about whether you are at high risk for HIV. Your health care provider may recommend a prescription medicine to help prevent HIV infection. If you choose to take medicine to prevent HIV, you should first get tested for HIV. You should then be tested every 3 months for as long as you are taking the medicine. Follow these instructions at home: Alcohol use Do  not drink alcohol if your health care provider tells you not to drink. If you drink alcohol: Limit how much you have to 0-2 drinks a day. Know how much alcohol is in your drink. In the [place] one drink equals one 12 oz bottle of beer (355 mL), one 5 oz glass of wine (148 mL), or one 1 oz glass of hard liquor (44 mL). Lifestyle Do not use any products that contain nicotine  or tobacco. These products include cigarettes, chewing tobacco, and vaping devices, such as e-cigarettes. If you need help quitting, ask your  health care provider. Do not use street drugs. Do not share needles. Ask your health care provider for help if you need support or information about quitting drugs. General instructions Schedule regular health, dental, and eye exams. Stay current with your vaccines. Tell your health care provider if: You often feel depressed. You have ever been abused or do not feel safe at home. Summary Adopting a healthy lifestyle and getting preventive care are important in promoting health and wellness. Follow your health care provider's instructions about healthy diet, exercising, and getting tested or screened for diseases. Follow your health care provider's instructions on monitoring your cholesterol and blood pressure. This information is not intended to replace advice given to you by your health care provider. Make sure you discuss any questions you have with your health care provider. Document Revised: 03/14/2021 Document Reviewed: 03/14/2021 Elsevier Patient Education  2024 Elsevier Inc. How to Take Your Blood Pressure Blood pressure measures how strongly your blood is pressing against the walls of your arteries. Arteries are blood vessels that carry blood from your heart throughout your body. You can take your blood pressure at home with a machine. You may need to check your blood pressure at home: To check if you have high blood pressure (hypertension). To check your blood pressure over time. To make sure your blood pressure medicine is working. Supplies needed: Blood pressure machine, or monitor. A chair to sit in. This should be a chair where you can sit upright with your back supported. Do not sit on a soft couch or an armchair. Table or desk. Small notebook. Pencil or pen. How to prepare Avoid these things for 30 minutes before checking your blood pressure: Having drinks with caffeine in them, such as coffee or tea. Drinking alcohol. Eating. Smoking. Exercising. Do these things  five minutes before checking your blood pressure: Go to the bathroom and pee (urinate). Sit in a chair. Be quiet. Do not talk. How to take your blood pressure Follow the instructions that came with your machine. If you have a digital blood pressure monitor, these may be the instructions: Sit up straight. Place your feet on the floor. Do not cross your ankles or legs. Rest your left arm at the level of your heart. You may rest it on a table, desk, or chair. Pull up your shirt sleeve. Wrap the blood pressure cuff around the upper part of your left arm. The cuff should be 1 inch (2.5 cm) above your elbow. It is best to wrap the cuff around bare skin. Fit the cuff snugly around your arm, but not too tightly. You should be able to place only one finger between the cuff and your arm. Place the cord so that it rests in the bend of your elbow. Press the power button. Sit quietly while the cuff fills with air and loses air. Write down the numbers on the screen. Wait 2-3 minutes and then repeat  steps 1-10. What do the numbers mean? Two numbers make up your blood pressure. The first number is called systolic pressure. The second is called diastolic pressure. An example of a blood pressure reading is 120 over 80 (or 120/80). If you are an adult and do not have a medical condition, use this guide to find out if your blood pressure is normal: Normal First number: below 120. Second number: below 80. Elevated First number: 120-129. Second number: below 80. Hypertension stage 1 First number: 130-139. Second number: 80-89. Hypertension stage 2 First number: 140 or above. Second number: 90 or above. Your blood pressure is above normal even if only the first or only the second number is above normal. Follow these instructions at home: Medicines Take over-the-counter and prescription medicines only as told by your doctor. Tell your doctor if your medicine is causing side effects. General  instructions Check your blood pressure as often as your doctor tells you to. Check your blood pressure at the same time every day. Take your monitor to your next doctor's appointment. Your doctor will: Make sure you are using it correctly. Make sure it is working right. Understand what your blood pressure numbers should be. Keep all follow-up visits. General tips You will need a blood pressure machine or monitor. Your doctor can suggest a monitor. You can buy one at a drugstore or online. When choosing one: Choose one with an arm cuff. Choose one that wraps around your upper arm. Only one finger should fit between your arm and the cuff. Do not choose one that measures your blood pressure from your wrist or finger. Where to find more information American Heart Association: www.heart.org Contact a doctor if: Your blood pressure keeps being high. Your blood pressure is suddenly low. Get help right away if: Your first blood pressure number is higher than 180. Your second blood pressure number is higher than 120. These symptoms may be an emergency. Do not wait to see if the symptoms will go away. Get help right away. Call 911. Summary Check your blood pressure at the same time every day. Avoid caffeine, alcohol, smoking, and exercise for 30 minutes before checking your blood pressure. Make sure you understand what your blood pressure numbers should be. This information is not intended to replace advice given to you by your health care provider. Make sure you discuss any questions you have with your health care provider. Document Revised: 07/07/2021 Document Reviewed: 07/07/2021 Elsevier Patient Education  2024 Elsevier Inc. Living With Sleep Apnea Sleep apnea is a condition that affects your breathing while you're sleeping. Your tongue or the tissue in your throat may block the flow of air while you sleep. You may have shallow breathing or stop breathing for short periods of time. The  breaks in breathing interrupt the deep sleep that you need to feel rested. Even if you don't wake up from the gaps in breathing, you may feel tired during the day. People with sleep apnea may snore loudly. You may have a headache in the morning and feel anxious or depressed. How can sleep apnea affect me? Sleep apnea increases your chances of being very tired during the day. This is called daytime fatigue. Sleep apnea can also increase your risk of: Heart attack. Stroke. Obesity. Type 2 diabetes. Heart failure. Irregular heartbeat. High blood pressure. If you are very tired during the day, you may be more likely to: Not do well in school or at work. Fall asleep while driving. Have trouble paying  attention. Develop depression or anxiety. Have problems having sex. This is called sexual dysfunction. What actions can I take to manage sleep apnea? Sleep apnea treatment  If you were given a device to open your airway while you sleep, use it only as told by your health care provider. You may be given: An oral appliance. This is a mouthpiece that shifts your lower jaw forward. A continuous positive airway pressure (CPAP) device. This blows air through a mask. A nasal expiratory positive airway pressure (EPAP) device. This has valves that you put into each nostril. A bi-level positive airway pressure (BIPAP) device. This blows air through a mask when you breathe in and breathe out. You may need surgery if other treatments don't work for you. Sleep habits Go to sleep and wake up at the same time every day. This helps set your internal clock for sleeping. If you stay up later than usual on weekends, try to get up in the morning within 2 hours of the time you usually wake up. Try to get at least 7-9 hours of sleep each night. Stop using a computer, tablet, and mobile phone a few hours before bedtime. Do not take long naps during the day. If you nap, limit it to 30 minutes. Have a relaxing bedtime  routine. Reading or listening to music may relax you and help you sleep. Use your bedroom only for sleep. Keep your television and computer out of your bedroom. Keep your bedroom cool, dark, and quiet. Use a supportive mattress and pillows. Follow your provider's instructions for other changes to sleep habits. Nutrition Do not eat big meals in the evening. Do not have caffeine in the later part of the day. The effects of caffeine can last for more than 5 hours. Follow your provider's instructions for any changes to what you eat and drink. Lifestyle Do not drink alcohol before bedtime. Alcohol can cause you to fall asleep at first, but then it can cause you to wake up in the middle of the night and have trouble getting back to sleep. Do not smoke, vape, or use nicotine  or tobacco. Medicines Take over-the-counter and prescription medicines only as told by your provider. Do not use over-the-counter sleep medicine. You may become dependent on this medicine, and it can make sleep apnea worse. Do not take medicines, such as sedatives and narcotics, unless told to by your provider. Activity Exercise on most days, but avoid exercising in the evening. Exercising near bedtime can interfere with sleeping. If possible, spend time outside every day. Natural light helps with your internal clock. General information Lose weight if you need to. Stay at a healthy weight. If you are having surgery, make sure to tell your provider that you have sleep apnea. You may need to bring your device with you. Keep all follow-up visits. Your provider will want to check on your condition. Where to find more information National Heart, Lung, and Blood Institute: buffalodrycleaner.gl This information is not intended to replace advice given to you by your health care provider. Make sure you discuss any questions you have with your health care provider. Document Revised: 02/14/2023 Document Reviewed: 02/14/2023 Elsevier Patient  Education  2024 Elsevier Inc. Diabetes Action Plan A diabetes action plan is a way for you to manage your symptoms of diabetes, also called diabetes mellitus. The plan is color-coded to guide you on what actions to take based on any symptoms you're having. If you have symptoms in the red zone, you need medical care  right away. If you have symptoms in the yellow zone, your diabetes isn't under control, and you may need to make some changes. If you have symptoms in the green zone, you're doing well. Understanding diabetes can take time. Follow the treatment plan that you created with your health care provider. Know the target range for your blood sugar, also called glucose. Review your plan each time you visit your provider. The target range for my blood sugar level is __________________________ mg/dL. Red zone Get medical help right away if you have any of the following symptoms: A blood sugar test result that's below 54 mg/dL (3 mmol/L). A blood sugar test result that's at or above 240 mg/dL (86.6 mmol/L) for 2 days in a row along with: Extreme thirst and frequent peeing. Confusion or trouble thinking clearly. Moderate or large ketone levels in your pee (urine). Feeling tired or having no energy. Trouble breathing. Sickness or a fever for 2 or more days that's not getting better. These symptoms may be an emergency. Call 911 right away. Do not wait to see if the symptoms will go away. Do not drive yourself to the hospital. If you have very low blood sugar, also called severe hypoglycemia, and you can't eat or drink, you may need glucagon. Make sure a family member or close friend knows how to check your blood sugar and how to give you glucagon. You may need to be treated in a hospital for this condition. Yellow zone If you have any of the following symptoms, your diabetes isn't under control, and you may need to make some changes: A blood sugar test result that's at or above 240 mg/dL (86.6  mmol/L) for 2 days in a row. Blood sugar test results that are below 70 mg/dL (3.9 mmol/L). Other symptoms of hypoglycemia, such as: Shaking or feeling light-headed. Confusion or irritability. Feeling hungry. Having a fast heartbeat. If you have any yellow zone symptoms: Treat your hypoglycemia by eating or drinking 15 grams of a rapid-acting carbohydrate. Follow the 15:15 rule: Take 15 grams of a rapid-acting carbohydrate, such as: 1 tube of glucose gel. 4 glucose pills. 4 oz (120 mL) of fruit juice. 4 oz (120 mL) of regular (not diet) soda. Check your blood sugar again 15 minutes after you take the carbohydrate. If the second blood sugar test is still at or below 70 mg/dL (3.9 mmol/L), take 15 grams of a carbohydrate again. If your blood sugar doesn't increase above 70 mg/dL (3.9 mmol/L) after 3 tries, get medical help right away. After your blood sugar returns to normal, eat a meal or a snack within 1 hour. Keep taking your daily medicines as told by your provider. Check your blood sugar more often than you normally would. Write down your results. Call your provider if you have trouble keeping your blood sugar in your target range. Green zone These signs mean you're doing well and can continue what you're doing to manage your diabetes: Your blood sugar is within your personal target range. For most people, a blood sugar level before a meal should be 80-130 mg/dL (4.4-7.2 mmol/L). You feel well, and you're able to do daily activities. If you're in the green zone, continue to manage your diabetes as told by your provider. To do this: Eat a healthy diet. Exercise regularly. Check your blood sugar as told. Take your medicines only as told. Where to find more information American Diabetes Association (ADA): diabetes.org Association of Diabetes Care & Education Specialists (ADCES): adces.org/diabetes-education-dsmes This information  is not intended to replace advice given to you by  your health care provider. Make sure you discuss any questions you have with your health care provider. Document Revised: 06/13/2023 Document Reviewed: 06/13/2023 Elsevier Patient Education  2024 [employer] Blood Glucose Monitoring, Adult To manage your diabetes, you'll need to keep track of your blood sugar. This is called blood glucose monitoring. Check your blood glucose as often as told. Keep a journal of your results over time. This can help you: Know when to adjust your diabetes management plan with your health care provider. See how food, exercise, illness, and medicines affect your blood glucose. Know what your blood glucose is at any time. Your provider will set specific goals for your blood glucose levels. In many cases, these goals may be: Before meals: 80-130 mg/dL (4.4-7.2 mmol/L). After meals: below 180 mg/dL (10 mmol/L). A1C level: less than 7%. Supplies needed: Blood glucose meter. Test strips for your meter. Each brand of meter has its own strips. You must use the strips that came with your meter. A lancet. This is a sharp device used to poke your finger. Do not use a lancet more than once. A journal or logbook to write down your results. How to check your blood glucose Checking your blood glucose  Wash your hands with soap and water for at least 20 seconds. Use the lancet to poke the side of your finger. Do not poke the tip of your finger. Also, try not to use the same finger each time. Gently squeeze the finger until a small drop of blood appears. Follow the meter instructions on how to insert the test strip, apply blood to the strip, and use the meter. Write down your result and any notes. Using different sites Some blood glucose meters allow testing on other parts of your body to test your blood. The most common places are the forearm, thigh, upper arm, and palm of the hand. Check your meter's instructions. Using different sites may not be as accurate as your  fingers. If you think you have low blood glucose, only use your finger. General tips Blood glucose log  Write down the result each time you check your blood glucose. Note anything that may be affecting your blood glucose. This can help you and your provider: Look for patterns over time. Adjust your management plan as needed. Check if your meter has an app or lets you download your records to a computer. Most meters keep a record of glucose readings in the meter. If you have type 1 diabetes: Check your blood glucose as often as told. This may be: Before each meal and snack. Two hours after a meal. Before bedtime. If you have symptoms of hypoglycemia. After treating your hypoglycemia. Before doing things that have a risk of injury, such as driving or using machinery. Before and after exercise. Between 2:00 a.m. and 3:00 a.m. You may need to check your blood glucose more often, such as up to 6-10 times a day, if: You have diabetes that is not well controlled. You are ill. You have a history of severe hypoglycemia. You have hypoglycemia unawareness. If you have type 2 diabetes: You may need to check your blood glucose 2 or more times a day. Check your blood glucose as often as told by your provider. This may include: Before and after exercise. Before doing things that have a risk of injury, such as driving or using machinery. You may need to check your blood glucose more often  if: Your medicine is being adjusted. Your diabetes is not well controlled. You are ill. General tips Always have your blood glucose meter and supplies with you. After you use a few boxes of test strips, adjust your blood glucose meter as needed. Follow the meter instructions. If you have questions or need help, all blood glucose meters have a 24-hour hotline phone number that you can call. Also, contact your provider with any questions or concerns. Where to find more information The American Diabetes  Association: diabetes.org The Association of Diabetes Care & Education Specialists: diabeteseducator.org Contact a health care provider if: Your blood glucose is at or above 240 mg/dL (86.6 mmol/L) for 2 days in a row. You have been sick or have had a fever for 2 days or longer and are not getting better. You have any of these problems for more than 6 hours: You cannot eat or drink. You have nausea or vomiting. You have diarrhea. Get help right away if: Your blood glucose is lower than 54 mg/dL (3 mmol/L). You become confused, or you have trouble thinking clearly. You have trouble breathing. You have moderate to high ketone levels in your pee. These symptoms may be an emergency. Get help right away. Call 911. Do not wait to see if the symptoms will go away. Do not drive yourself to the hospital. This information is not intended to replace advice given to you by your health care provider. Make sure you discuss any questions you have with your health care provider. Document Revised: 06/05/2023 Document Reviewed: 09/08/2022 Elsevier Patient Education  2024 [employer]  The above assessment and management plan was discussed with the patient. The patient verbalized understanding of and has agreed to the management plan. Patient is aware to call the clinic if they develop any new symptoms or if symptoms persist or worsen. Patient is aware when to return to the clinic for a follow-up visit. Patient educated on when it is appropriate to go to the emergency department.   Robie Mcniel [place] Thompson, DNP [facility] [address] [place], [place] [address] [phone]

## 2024-09-15 ENCOUNTER — Ambulatory Visit: Admitting: Nurse Practitioner

## 2024-09-15 ENCOUNTER — Ambulatory Visit: Payer: Self-pay | Admitting: Nurse Practitioner

## 2024-09-15 ENCOUNTER — Encounter: Payer: Self-pay | Admitting: Nurse Practitioner

## 2024-09-15 VITALS — BP 128/82 | HR 89 | Temp 97.9°F | Ht 74.0 in | Wt 251.2 lb

## 2024-09-15 DIAGNOSIS — I10 Essential (primary) hypertension: Secondary | ICD-10-CM

## 2024-09-15 DIAGNOSIS — R748 Abnormal levels of other serum enzymes: Secondary | ICD-10-CM

## 2024-09-15 DIAGNOSIS — G4733 Obstructive sleep apnea (adult) (pediatric): Secondary | ICD-10-CM

## 2024-09-15 DIAGNOSIS — Z0001 Encounter for general adult medical examination with abnormal findings: Secondary | ICD-10-CM

## 2024-09-15 DIAGNOSIS — Z6832 Body mass index (BMI) 32.0-32.9, adult: Secondary | ICD-10-CM

## 2024-09-15 DIAGNOSIS — R7303 Prediabetes: Secondary | ICD-10-CM

## 2024-09-15 DIAGNOSIS — Z8639 Personal history of other endocrine, nutritional and metabolic disease: Secondary | ICD-10-CM

## 2024-09-15 DIAGNOSIS — Z1211 Encounter for screening for malignant neoplasm of colon: Secondary | ICD-10-CM | POA: Insufficient documentation

## 2024-09-15 DIAGNOSIS — J301 Allergic rhinitis due to pollen: Secondary | ICD-10-CM

## 2024-09-15 DIAGNOSIS — R631 Polydipsia: Secondary | ICD-10-CM | POA: Diagnosis not present

## 2024-09-15 DIAGNOSIS — Z716 Tobacco abuse counseling: Secondary | ICD-10-CM

## 2024-09-15 DIAGNOSIS — M5416 Radiculopathy, lumbar region: Secondary | ICD-10-CM | POA: Insufficient documentation

## 2024-09-15 DIAGNOSIS — E66811 Obesity, class 1: Secondary | ICD-10-CM

## 2024-09-15 DIAGNOSIS — R351 Nocturia: Secondary | ICD-10-CM

## 2024-09-15 DIAGNOSIS — F17210 Nicotine dependence, cigarettes, uncomplicated: Secondary | ICD-10-CM

## 2024-09-15 LAB — LIPID PANEL

## 2024-09-15 LAB — GLUCOSE HEMOCUE WAIVED: Glu Hemocue Waived: 214 mg/dL — ABNORMAL HIGH (ref 70–99)

## 2024-09-15 MED ORDER — METFORMIN HCL 1000 MG PO TABS: 1000.0000 mg | ORAL_TABLET | Freq: Two times a day (BID) | ORAL | 3 refills | Status: AC

## 2024-09-15 MED ORDER — LANCET DEVICE MISC: 1.0000 | 1 refills | Status: AC

## 2024-09-15 MED ORDER — BLOOD GLUCOSE MONITORING SUPPL DEVI: 1.0000 | 0 refills | Status: AC

## 2024-09-15 MED ORDER — LISINOPRIL 10 MG PO TABS: 10.0000 mg | ORAL_TABLET | Freq: Every day | ORAL | 0 refills | Status: AC

## 2024-09-15 MED ORDER — CETIRIZINE HCL 10 MG PO TABS: 10.0000 mg | ORAL_TABLET | Freq: Every day | ORAL | 0 refills | Status: AC

## 2024-09-16 DIAGNOSIS — R748 Abnormal levels of other serum enzymes: Secondary | ICD-10-CM | POA: Insufficient documentation

## 2024-09-16 LAB — CBC WITH DIFFERENTIAL/PLATELET
Basophils Absolute: 0 x10E3/uL (ref 0.0–0.2)
Basos: 1 %
EOS (ABSOLUTE): 0.1 x10E3/uL (ref 0.0–0.4)
Eos: 1 %
Hematocrit: 46 % (ref 37.5–51.0)
Hemoglobin: 15.7 g/dL (ref 13.0–17.7)
Immature Grans (Abs): 0 x10E3/uL (ref 0.0–0.1)
Immature Granulocytes: 0 %
Lymphocytes Absolute: 1.5 x10E3/uL (ref 0.7–3.1)
Lymphs: 30 %
MCH: 30 pg (ref 26.6–33.0)
MCHC: 34.1 g/dL (ref 31.5–35.7)
MCV: 88 fL (ref 79–97)
Monocytes Absolute: 0.3 x10E3/uL (ref 0.1–0.9)
Monocytes: 5 %
Neutrophils Absolute: 3.1 x10E3/uL (ref 1.4–7.0)
Neutrophils: 63 %
Platelets: 123 x10E3/uL — ABNORMAL LOW (ref 150–450)
RBC: 5.23 x10E6/uL (ref 4.14–5.80)
RDW: 13.9 % (ref 11.6–15.4)
WBC: 4.9 x10E3/uL (ref 3.4–10.8)

## 2024-09-16 LAB — CMP14+EGFR
ALT: 80 IU/L — AB (ref 0–44)
AST: 36 IU/L (ref 0–40)
Albumin: 4.3 g/dL (ref 4.1–5.1)
Alkaline Phosphatase: 79 IU/L (ref 47–123)
BUN/Creatinine Ratio: 14 (ref 9–20)
BUN: 14 mg/dL (ref 6–24)
Bilirubin Total: 1.2 mg/dL (ref 0.0–1.2)
CO2: 21 mmol/L (ref 20–29)
Calcium: 9.1 mg/dL (ref 8.7–10.2)
Chloride: 104 mmol/L (ref 96–106)
Creatinine, Ser: 0.99 mg/dL (ref 0.76–1.27)
Globulin, Total: 2.4 g/dL (ref 1.5–4.5)
Glucose: 204 mg/dL — AB (ref 70–99)
Potassium: 4.5 mmol/L (ref 3.5–5.2)
Sodium: 139 mmol/L (ref 134–144)
Total Protein: 6.7 g/dL (ref 6.0–8.5)
eGFR: 95 mL/min/1.73 (ref >59)

## 2024-09-16 LAB — LIPID PANEL
Cholesterol, Total: 140 mg/dL (ref 100–199)
HDL: 33 mg/dL — AB (ref >39)
LDL CALC COMMENT:: 4.2 ratio (ref 0.0–5.0)
LDL Chol Calc (NIH): 85 mg/dL (ref 0–99)
Triglycerides: 124 mg/dL (ref 0–149)
VLDL Cholesterol Cal: 22 mg/dL (ref 5–40)

## 2024-09-16 LAB — THYROID PANEL WITH TSH
Free Thyroxine Index: 2 (ref 1.2–4.9)
T3 Uptake Ratio: 25 % (ref 24–39)
T4, Total: 7.8 ug/dL (ref 4.5–12.0)
TSH: 1.08 u[IU]/mL (ref 0.450–4.500)

## 2024-09-16 LAB — PSA, TOTAL AND FREE
PSA, Free Pct: 46.7 %
PSA, Free: 0.14 ng/mL
Prostate Specific Ag, Serum: 0.3 ng/mL (ref 0.0–4.0)

## 2024-09-16 LAB — HEPATITIS C ANTIBODY: Hep C Virus Ab: NONREACTIVE

## 2024-09-29 LAB — COLOGUARD: COLOGUARD: NEGATIVE

## 2024-12-18 ENCOUNTER — Ambulatory Visit: Admitting: Nurse Practitioner
